# Patient Record
Sex: Female | Born: 1995 | Race: White | Hispanic: No | Smoking: Current every day smoker
Health system: Southern US, Community
[De-identification: ages and names within clinical notes are randomized; demographics above are authoritative.]

## PROBLEM LIST (undated history)

## (undated) DIAGNOSIS — G43909 Migraine, unspecified, not intractable, without status migrainosus: Secondary | ICD-10-CM

## (undated) DIAGNOSIS — J45909 Unspecified asthma, uncomplicated: Secondary | ICD-10-CM

## (undated) DIAGNOSIS — K5900 Constipation, unspecified: Secondary | ICD-10-CM

## (undated) DIAGNOSIS — T7840XA Allergy, unspecified, initial encounter: Secondary | ICD-10-CM

## (undated) DIAGNOSIS — K37 Unspecified appendicitis: Secondary | ICD-10-CM

## (undated) HISTORY — PX: ELBOW SURGERY: SHX618

## (undated) HISTORY — DX: Migraine, unspecified, not intractable, without status migrainosus: G43.909

## (undated) HISTORY — DX: Allergy, unspecified, initial encounter: T78.40XA

---
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
## 2017-05-03 ENCOUNTER — Ambulatory Visit: Payer: Medicaid Other | Admitting: Family Medicine

## 2017-05-15 NOTE — Progress Notes (Addendum)
Subjective: [name] care, allergies HPI: Amber Marsh is a 22 y.o. female presenting to clinic today for:  1. Allergies Patient reports a long-standing history of seasonal allergies.  She describes eye puffiness, tearing, sneezing.  Symptoms are well relieved by Claritin.  She needs refills on this.  2.  Exercise-induced asthma Patient reports long-standing history of asthma symptoms.  She notes that when she was initially diagnosed it was because she was having shortness of breath with track.  She notes that she rarely has to use albuterol now but occasionally will need to use it when [place].  3.  Irregular menstrual cycles/thyroid dysregulation/anemia Patient reports a long-standing history of irregular menstrual cycles.  She notes that her cycles are regulated when she was on the birth control patch.  She has been on this since age 54 with good regulation of her cycles.  She reports last menstrual cycle was in June 2018.  She is sexually active.  Denies any abnormal vaginal discharge, vaginal lesions or pelvic pain.  She has been told in the past that her thyroid is "messed up".  She is never been on any thyroid medications for this.  She also reports a past medical history of anemia.  She would like to be checked for both of these.  She is a current everyday smoker.  4.  Daily smoker Patient reports that she has been a daily smoker for the last 3 years.  She smokes up to 10 cigarettes/day per her husband's report.  Denies any coughing, hemoptysis, unplanned weight loss.  5. Migraine headache Patient reports that she has about 1 migraine headache per month.  This is relieved by Imitrex 50 mg.  She notes that she was diagnosed with migraine headaches at age 42 years but started Imitrex at age 62 years old.  Past Medical History:  Diagnosis Date  . Allergy    seasonal allergy  . Appendicitis 05/2016  . Asthma    exercise induced   Past  Surgical History:  Procedure Laterality Date  . APPENDECTOMY  06/10/2016  . KNEE SURGERY Right 2018  . LAPAROSCOPIC APPENDECTOMY N/A 06/10/2016   Procedure: APPENDECTOMY LAPAROSCOPIC OPEN;  Surgeon: Erroll Luna, MD;  Location: [facility];  Service: General;  Laterality: N/A;   Social History   Socioeconomic History  . Marital status: Significant Other    Spouse name: Not on file  . Number of children: 0  . Years of education: Not on file  . Highest education level: Not on file  Occupational History  . Occupation: Educational psychologist    Comment: [place]  Social Needs  . Financial resource strain: Not on file  . Food insecurity:    Worry: Not on file    Inability: Not on file  . Transportation needs:    Medical: Not on file    Non-medical: Not on file  Tobacco Use  . Smoking status: Current Every Day Smoker    Packs/day: 0.50    Years: 3.00    Pack years: 1.50    Types: Cigarettes  . Smokeless tobacco: Never Used  Substance and Sexual Activity  . Alcohol use: Yes    Comment: occasional  . Drug use: No  . Sexual activity: Yes    Birth control/protection: Patch  Lifestyle  . Physical activity:    Days per week: Not on file    Minutes per session: Not on file  . Stress: Not on file  Relationships  . Social  connections:    Talks on phone: Not on file    Gets together: Not on file    Attends religious service: Not on file    Active member of club or organization: Not on file    Attends meetings of clubs or organizations: Not on file    Relationship status: Not on file  . Intimate partner violence:    Fear of current or ex partner: Not on file    Emotionally abused: Not on file    Physically abused: Not on file    Forced sexual activity: Not on file  Other Topics Concern  . Not on file  Social History Narrative  . Not on file   No outpatient medications have been marked as taking for the 05/21/17 encounter (Office Visit) with Janora Norlander, DO.   Family History    Problem Relation Age of Onset  . Migraines Mother   . Hypertension Father   . Hypertension Maternal Grandmother   . Migraines Maternal Grandmother   . Diabetes Paternal Grandmother    Allergies  Allergen Reactions  . Grapeseed Extract [Nutritional Supplements]   . Latex   . Morphine And Related   . Penicillins     Childhood allergy     Health Maintenance: UTD ROS: Per HPI  Objective: Office vital signs reviewed. BP 109/71   Pulse (!) 108   Temp 98 F (36.7 C) (Oral)   Ht 5' 4" (1.626 m)   Wt 180 lb (81.6 kg)   BMI 30.90 kg/m   Physical Examination:  General: Awake, alert, well nourished, No acute distress HEENT: Normal    Neck: No masses palpated. No lymphadenopathy; no thyroid enlargement.  No palpable thyroid nodules    ears: Tympanic membranes intact, normal light reflex, no erythema, no bulging    Eyes: PERRLA, extraocular movement in tact, sclera white    Nose: nasal turbinates moist, clear nasal discharge    Throat: moist mucus membranes, no erythema, no tonsillar exudate.  Airway is patent Cardio: regular rate and rhythm, S1S2 heard, no murmurs appreciated Pulm: clear to auscultation bilaterally, no wheezes, rhonchi or rales; normal work of breathing on room air Extremities: warm, well perfused, No edema, cyanosis or clubbing; +2 pulses bilaterally MSK: Normal gait and normal station Skin: dry; intact; no rashes or lesions Neuro: No focal neurologic deficits. Psych: Mood stable, affect flat, eye contact fair, does not appear to be responding to internal stimuli Depression screen PHQ 2/9 06/21/2016  Decreased Interest 0  Down, Depressed, Hopeless 0  PHQ - 2 Score 0     Assessment/ Plan: 22 y.o. female   1. Encounter for initial prescription of transdermal patch hormonal contraceptive device Upreg negative. She has been using for contraception and dysfunctional uterine bleeding.  Will check labs as above.  If she has not had a pelvic ultrasound, she may  benefit from this.  We will continue to follow. - Pregnancy, urine  2. Establishing care with new doctor, encounter for   3. Exercise induced bronchospasm Albuterol inhaler refilled.  If she is requiring this more than twice per week, she will follow-up to be placed on a controller medication.  4. Seasonal allergies Claritin tablets refilled.  Follow-up as needed.  5. Obesity (BMI 30-39.9) Check TSH CMP.  Handout given for lifestyle modification and preventative care. - CMP14+EGFR - TSH  6. History of anemia Check CBC. - CBC with Differential  7. Migraine without aura and without status migrainosus, not intractable Imitrex 50 mg refilled.  Follow-up as needed.  8. Tobacco use Counseling performed.  Patient is contemplative.    Janora Norlander, DO [place] [phone]

## 2017-05-21 ENCOUNTER — Ambulatory Visit (INDEPENDENT_AMBULATORY_CARE_PROVIDER_SITE_OTHER): Payer: Medicaid Other | Admitting: Family Medicine

## 2017-05-21 ENCOUNTER — Encounter: Payer: Self-pay | Admitting: Family Medicine

## 2017-05-21 VITALS — BP 109/71 | HR 108 | Temp 98.0°F | Ht 64.0 in | Wt 180.0 lb

## 2017-05-21 DIAGNOSIS — Z30016 Encounter for initial prescription of transdermal patch hormonal contraceptive device: Secondary | ICD-10-CM | POA: Diagnosis not present

## 2017-05-21 DIAGNOSIS — Z7689 Persons encountering health services in other specified circumstances: Secondary | ICD-10-CM

## 2017-05-21 DIAGNOSIS — G43009 Migraine without aura, not intractable, without status migrainosus: Secondary | ICD-10-CM | POA: Diagnosis not present

## 2017-05-21 DIAGNOSIS — E669 Obesity, unspecified: Secondary | ICD-10-CM | POA: Insufficient documentation

## 2017-05-21 DIAGNOSIS — J45990 Exercise induced bronchospasm: Secondary | ICD-10-CM | POA: Diagnosis not present

## 2017-05-21 DIAGNOSIS — Z862 Personal history of diseases of the blood and blood-forming organs and certain disorders involving the immune mechanism: Secondary | ICD-10-CM

## 2017-05-21 DIAGNOSIS — J302 Other seasonal allergic rhinitis: Secondary | ICD-10-CM | POA: Insufficient documentation

## 2017-05-21 DIAGNOSIS — Z720 Tobacco use: Secondary | ICD-10-CM | POA: Insufficient documentation

## 2017-05-21 LAB — PREGNANCY, URINE: Preg Test, Ur: NEGATIVE

## 2017-05-21 MED ORDER — NORELGESTROMIN-ETH ESTRADIOL 150-35 MCG/24HR TD PTWK
1.0000 | MEDICATED_PATCH | TRANSDERMAL | 4 refills | Status: DC
Start: 2017-05-21 — End: 2017-11-27

## 2017-05-21 MED ORDER — SUMATRIPTAN SUCCINATE 50 MG PO TABS: 50.0000 mg | ORAL_TABLET | ORAL | 1 refills | Status: DC | PRN

## 2017-05-21 MED ORDER — ALBUTEROL SULFATE HFA 108 (90 BASE) MCG/ACT IN AERS: 1.0000 | INHALATION_SPRAY | RESPIRATORY_TRACT | 1 refills | Status: DC | PRN

## 2017-05-21 MED ORDER — LORATADINE 10 MG PO TABS: 10.0000 mg | ORAL_TABLET | Freq: Every day | ORAL | 4 refills | Status: DC

## 2017-05-21 NOTE — Patient Instructions (Signed)
You had labs performed today.  You will be contacted with the results of the labs once they are available, usually in the next 3 days for routine lab work.   Preventive Care 18-39 Years, Female Preventive care refers to lifestyle choices and visits with your health care provider that can promote health and wellness. What does preventive care include?  A yearly physical exam. This is also called an annual well check.  Dental exams once or twice a year.  Routine eye exams. Ask your health care provider how often you should have your eyes checked.  Personal lifestyle choices, including: ? Daily care of your teeth and gums. ? Regular physical activity. ? Eating a healthy diet. ? Avoiding tobacco and drug use. ? Limiting alcohol use. ? Practicing safe sex. ? Taking vitamin and mineral supplements as recommended by your health care provider. What happens during an annual well check? The services and screenings done by your health care provider during your annual well check will depend on your age, overall health, lifestyle risk factors, and family history of disease. Counseling Your health care provider may ask you questions about your:  Alcohol use.  Tobacco use.  Drug use.  Emotional well-being.  Home and relationship well-being.  Sexual activity.  Eating habits.  Work and work Statistician.  Method of birth control.  Menstrual cycle.  Pregnancy history.  Screening You may have the following tests or measurements:  Height, weight, and BMI.  Diabetes screening. This is done by checking your blood sugar (glucose) after you have not eaten for a while (fasting).  Blood pressure.  Lipid and cholesterol levels. These may be checked every 5 years starting at age 64.  Skin check.  Hepatitis C blood test.  Hepatitis B blood test.  Sexually transmitted disease (STD) testing.  BRCA-related cancer screening. This may be done if you have a family history of breast,  ovarian, tubal, or peritoneal cancers.  Pelvic exam and Pap test. This may be done every 3 years starting at age 59. Starting at age 38, this may be done every 5 years if you have a Pap test in combination with an HPV test.  Discuss your test results, treatment options, and if necessary, the need for more tests with your health care provider. Vaccines Your health care provider may recommend certain vaccines, such as:  Influenza vaccine. This is recommended every year.  Tetanus, diphtheria, and acellular pertussis (Tdap, Td) vaccine. You may need a Td booster every 10 years.  Varicella vaccine. You may need this if you have not been vaccinated.  HPV vaccine. If you are 82 or younger, you may need three doses over 6 months.  Measles, mumps, and rubella (MMR) vaccine. You may need at least one dose of MMR. You may also need a second dose.  Pneumococcal 13-valent conjugate (PCV13) vaccine. You may need this if you have certain conditions and were not previously vaccinated.  Pneumococcal polysaccharide (PPSV23) vaccine. You may need one or two doses if you smoke cigarettes or if you have certain conditions.  Meningococcal vaccine. One dose is recommended if you are age 41-21 years and a first-year college student living in a residence hall, or if you have one of several medical conditions. You may also need additional booster doses.  Hepatitis A vaccine. You may need this if you have certain conditions or if you travel or work in places where you may be exposed to hepatitis A.  Hepatitis B vaccine. You may need this if  you have certain conditions or if you travel or work in places where you may be exposed to hepatitis B.  Haemophilus influenzae type b (Hib) vaccine. You may need this if you have certain risk factors.  Talk to your health care provider about which screenings and vaccines you need and how often you need them. This information is not intended to replace advice given to you by  your health care provider. Make sure you discuss any questions you have with your health care provider. Document Released: 02/27/2001 Document Revised: 09/21/2015 Document Reviewed: 11/02/2014 Elsevier Interactive Patient Education  [employer].

## 2017-05-22 LAB — CBC WITH DIFFERENTIAL/PLATELET
BASOS: 0 %
Basophils Absolute: 0 10*3/uL (ref 0.0–0.2)
EOS (ABSOLUTE): 0.1 10*3/uL (ref 0.0–0.4)
Eos: 1 %
HEMATOCRIT: 40.2 % (ref 34.0–46.6)
HEMOGLOBIN: 13.7 g/dL (ref 11.1–15.9)
Immature Grans (Abs): 0 10*3/uL (ref 0.0–0.1)
Immature Granulocytes: 0 %
LYMPHS: 35 %
Lymphocytes Absolute: 3.1 10*3/uL (ref 0.7–3.1)
MCH: 27.9 pg (ref 26.6–33.0)
MCHC: 34.1 g/dL (ref 31.5–35.7)
MCV: 82 fL (ref 79–97)
Monocytes Absolute: 0.7 10*3/uL (ref 0.1–0.9)
Monocytes: 8 %
NEUTROS PCT: 56 %
Neutrophils Absolute: 4.8 10*3/uL (ref 1.4–7.0)
Platelets: 276 10*3/uL (ref 150–379)
RBC: 4.91 x10E6/uL (ref 3.77–5.28)
RDW: 14.6 % (ref 12.3–15.4)
WBC: 8.8 10*3/uL (ref 3.4–10.8)

## 2017-05-22 LAB — CMP14+EGFR
ALK PHOS: 112 IU/L (ref 39–117)
ALT: 10 IU/L (ref 0–32)
AST: 14 IU/L (ref 0–40)
Albumin/Globulin Ratio: 1.2 (ref 1.2–2.2)
Albumin: 3.8 g/dL (ref 3.5–5.5)
BUN/Creatinine Ratio: 18 (ref 9–23)
BUN: 11 mg/dL (ref 6–20)
Bilirubin Total: 0.4 mg/dL (ref 0.0–1.2)
CO2: 20 mmol/L (ref 20–29)
CREATININE: 0.62 mg/dL (ref 0.57–1.00)
Calcium: 9.3 mg/dL (ref 8.7–10.2)
Chloride: 106 mmol/L (ref 96–106)
GFR calc Af Amer: 151 mL/min/{1.73_m2} (ref >59)
GFR, EST NON AFRICAN AMERICAN: 131 mL/min/{1.73_m2} (ref >59)
Globulin, Total: 3.1 g/dL (ref 1.5–4.5)
Glucose: 81 mg/dL (ref 65–99)
POTASSIUM: 4.3 mmol/L (ref 3.5–5.2)
SODIUM: 140 mmol/L (ref 134–144)
Total Protein: 6.9 g/dL (ref 6.0–8.5)

## 2017-05-22 LAB — TSH: TSH: 1.07 u[IU]/mL (ref 0.450–4.500)

## 2017-08-02 DIAGNOSIS — M25561 Pain in right knee: Secondary | ICD-10-CM | POA: Diagnosis not present

## 2017-09-08 ENCOUNTER — Other Ambulatory Visit: Payer: Self-pay

## 2017-09-08 ENCOUNTER — Emergency Department (HOSPITAL_COMMUNITY)
Admission: EM | Admit: 2017-09-08 | Discharge: 2017-09-08 | Disposition: A | Discharge status: Home / Self Care | Payer: Medicaid Other | Attending: Emergency Medicine | Admitting: Emergency Medicine

## 2017-09-08 ENCOUNTER — Emergency Department (HOSPITAL_COMMUNITY): Payer: Medicaid Other

## 2017-09-08 ENCOUNTER — Encounter (HOSPITAL_COMMUNITY): Payer: Self-pay | Admitting: Emergency Medicine

## 2017-09-08 DIAGNOSIS — F17210 Nicotine dependence, cigarettes, uncomplicated: Secondary | ICD-10-CM | POA: Diagnosis not present

## 2017-09-08 DIAGNOSIS — Z91040 Latex allergy status: Secondary | ICD-10-CM | POA: Insufficient documentation

## 2017-09-08 DIAGNOSIS — Z79899 Other long term (current) drug therapy: Secondary | ICD-10-CM | POA: Diagnosis not present

## 2017-09-08 DIAGNOSIS — R079 Chest pain, unspecified: Secondary | ICD-10-CM | POA: Diagnosis not present

## 2017-09-08 DIAGNOSIS — R0602 Shortness of breath: Secondary | ICD-10-CM | POA: Diagnosis not present

## 2017-09-08 DIAGNOSIS — R0789 Other chest pain: Secondary | ICD-10-CM | POA: Diagnosis not present

## 2017-09-08 DIAGNOSIS — J45909 Unspecified asthma, uncomplicated: Secondary | ICD-10-CM | POA: Insufficient documentation

## 2017-09-08 DIAGNOSIS — R071 Chest pain on breathing: Secondary | ICD-10-CM | POA: Diagnosis not present

## 2017-09-08 LAB — BASIC METABOLIC PANEL
ANION GAP: 8 (ref 5–15)
BUN: 9 mg/dL (ref 6–20)
CO2: 23 mmol/L (ref 22–32)
Calcium: 8.9 mg/dL (ref 8.9–10.3)
Chloride: 111 mmol/L (ref 98–111)
Creatinine, Ser: 0.55 mg/dL (ref 0.44–1.00)
GFR calc non Af Amer: >60 mL/min (ref >60)
GLUCOSE: 98 mg/dL (ref 70–99)
POTASSIUM: 3.9 mmol/L (ref 3.5–5.1)
Sodium: 142 mmol/L (ref 135–145)

## 2017-09-08 LAB — TROPONIN I

## 2017-09-08 LAB — CBC
HEMATOCRIT: 42.6 % (ref 36.0–46.0)
HEMOGLOBIN: 13.7 g/dL (ref 12.0–15.0)
MCH: 28.7 pg (ref 26.0–34.0)
MCHC: 32.2 g/dL (ref 30.0–36.0)
MCV: 89.1 fL (ref 78.0–100.0)
Platelets: 272 10*3/uL (ref 150–400)
RBC: 4.78 MIL/uL (ref 3.87–5.11)
RDW: 13.5 % (ref 11.5–15.5)
WBC: 9.8 10*3/uL (ref 4.0–10.5)

## 2017-09-08 LAB — D-DIMER, QUANTITATIVE (NOT AT ARMC): D DIMER QUANT: 0.27 ug{FEU}/mL (ref 0.00–0.50)

## 2017-09-08 LAB — HCG, QUANTITATIVE, PREGNANCY: hCG, Beta Chain, Quant, S: <1 m[IU]/mL (ref <5)

## 2017-09-08 MED ORDER — METHOCARBAMOL 500 MG PO TABS: 1000.0000 mg | ORAL_TABLET | Freq: Four times a day (QID) | ORAL | 0 refills | Status: DC | PRN

## 2017-09-08 MED ORDER — IBUPROFEN 400 MG PO TABS
400.0000 mg | ORAL_TABLET | Freq: Once | ORAL | Status: AC
  Administered 2017-09-08: 400 mg via ORAL
  Filled 2017-09-08: qty 1

## 2017-09-08 MED ORDER — ACETAMINOPHEN 325 MG PO TABS
650.0000 mg | ORAL_TABLET | Freq: Once | ORAL | Status: AC
  Administered 2017-09-08: 650 mg via ORAL
  Filled 2017-09-08: qty 2

## 2017-09-08 MED ORDER — NAPROXEN 250 MG PO TABS: 250.0000 mg | ORAL_TABLET | Freq: Two times a day (BID) | ORAL | 0 refills | Status: DC | PRN

## 2017-09-08 NOTE — ED Notes (Signed)
Pt ambulated in hall   Pulse ox between 98-100 while ambulating  [place] "not connected" with previous physician  NAD  Awaiting eval and DC

## 2017-09-08 NOTE — ED Notes (Signed)
Pt hooked up to 5 lead, changed into gown, given warm blanket

## 2017-09-08 NOTE — Discharge Instructions (Signed)
Take the prescriptions as directed.  Apply moist heat or ice to the area(s) of discomfort, for 15 minutes at a time, several times per day for the next few days.  Do not fall asleep on a heating or ice pack.  Call your regular medical doctor on Monday to schedule a follow up appointment this week.  Return to the Emergency Department immediately if worsening. ° °

## 2017-09-08 NOTE — ED Notes (Signed)
Pt request upon discharge a work excuse   States she works at [employer] "doing basically everything"  Work excuse provided per pt request

## 2017-09-08 NOTE — ED Triage Notes (Signed)
Pt c/o R sided CP for several days. Has had increased stress. States CP is worse with palpation to chest, stress, and deep breathing. No PO medications today.

## 2017-09-08 NOTE — ED Provider Notes (Signed)
[facility] EMERGENCY DEPARTMENT Provider Note   CSN: [id number] Arrival date & time: 09/08/17  1637     History   Chief Complaint Chief Complaint  Patient presents with  . Chest Pain    HPI Amber Marsh is a 22 y.o. female.  HPI  Pt was seen at 2010. Per pt, c/o gradual onset and persistence of constant right sided chest wall "pain" for the past 2 to 3 days. Pt describes the CP as "stabbing," constant, and worse with palpation of the area and deep breath. Pt states she is "under a lot of stress" before the pain began. Pt states the pain began while at work at the [employer]. 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   Past Medical History:  Diagnosis Date  . Allergy    seasonal allergy  . Appendicitis 05/2016  . Asthma    exercise induced    Patient Active Problem List   Diagnosis Date Noted  . Obesity (BMI 30-39.9) 05/21/2017  . Seasonal allergies 05/21/2017  . Encounter for initial prescription of transdermal patch hormonal contraceptive device 05/21/2017  . Migraine without aura and without status migrainosus, not intractable 05/21/2017  . Tobacco use 05/21/2017  . Migraines 06/24/2016  . Bronchospasm 06/24/2016    Past Surgical History:  Procedure Laterality Date  . APPENDECTOMY  06/10/2016  . KNEE SURGERY Right 2018  . LAPAROSCOPIC APPENDECTOMY N/A 06/10/2016   Procedure: APPENDECTOMY LAPAROSCOPIC OPEN;  Surgeon: Harriette Bouillonornett, Thomas, MD;  Location: [facility] OR;  Service: General;  Laterality: N/A;     OB History   None      Home Medications    Prior to Admission medications   Medication Sig Start Date End Date Taking? Authorizing Provider  albuterol (PROVENTIL HFA;VENTOLIN HFA) 108 (90 Base) MCG/ACT inhaler Inhale 1-2 puffs into the lungs every 4 (four) hours as needed for wheezing or shortness of breath.  05/21/17  Yes Gottschalk, Kathie RhodesAshly M, DO  ibuprofen (ADVIL,MOTRIN) 200 MG tablet Take 200 mg by mouth every 6 (six) hours as needed.   Yes [provider]  loratadine (CLARITIN) 10 MG tablet Take 1 tablet (10 mg total) by mouth daily. 05/21/17  Yes Gottschalk, Kathie RhodesAshly M, DO  norelgestromin-ethinyl estradiol Burr Medico(XULANE) 150-35 MCG/24HR transdermal patch Place 1 patch onto the skin once a week. 05/21/17  Yes Gottschalk, Kathie RhodesAshly M, DO  SUMAtriptan (IMITREX) 50 MG tablet Take 1 tablet (50 mg total) by mouth every 2 (two) hours as needed for migraine. May repeat 1x in 2 hours if headache persists or recurs. 05/21/17  Yes Raliegh IpGottschalk, Ashly M, DO    Family History Family History  Problem Relation Age of Onset  . Migraines Mother   . Hypertension Father   . Hypertension Maternal Grandmother   . Migraines Maternal Grandmother   . Diabetes Paternal Grandmother     Social History Social History   Tobacco Use  . Smoking status: Current Every Day Smoker    Packs/day: 0.50    Years: 3.00    Pack years: 1.50    Types: Cigarettes  . Smokeless tobacco: Never Used  Substance Use Topics  . Alcohol use: Yes    Comment: occasional  . Drug use: No     Allergies   Grapeseed extract [nutritional supplements]; Latex; Morphine and related; and Penicillins   Review of Systems Review of Systems  ROS: Statement: All systems negative except as marked or noted in the HPI; Constitutional: Negative for fever and chills. ; ; Eyes: Negative for eye pain, redness and discharge. ; ; ENMT: Negative for ear pain, hoarseness, nasal congestion, sinus pressure and sore throat. ; ; Cardiovascular: Negative for palpitations, diaphoresis, dyspnea and peripheral edema. ; ; Respiratory: Negative for cough, wheezing and stridor. ; ; Gastrointestinal: Negative for nausea, vomiting, diarrhea, abdominal pain, blood in stool, hematemesis, jaundice and rectal bleeding. . ; ; Genitourinary: Negative for dysuria, flank pain and hematuria. ;  ; Musculoskeletal: +chest wall pain. Negative for back pain and neck pain. Negative for swelling and trauma.; ; Skin: Negative for pruritus, rash, abrasions, blisters, bruising and skin lesion.; ; Neuro: Negative for headache, lightheadedness and neck stiffness. Negative for weakness, altered level of consciousness, altered mental status, extremity weakness, paresthesias, involuntary movement, seizure and syncope.       Physical Exam Updated Vital Signs BP 106/70   Pulse 79   Temp 98.2 F (36.8 C) (Oral)   Resp 16   Ht 5\' 8"  (1.727 m)   Wt 81.6 kg   LMP 08/18/2017   SpO2 100%   BMI 27.35 kg/m   Physical Exam 2015: Physical examination:  Nursing notes reviewed; Vital signs and O2 SAT reviewed;  Constitutional: Well developed, Well nourished, Well hydrated, In no acute distress; Head:  Normocephalic, atraumatic; Eyes: EOMI, PERRL, No scleral icterus; ENMT: Mouth and pharynx normal, Mucous membranes moist; Neck: Supple, Full range of motion, No lymphadenopathy; Cardiovascular: Regular rate and rhythm, No gallop; Respiratory: Breath sounds clear & equal bilaterally, No wheezes.  Speaking full sentences with ease, Normal respiratory effort/excursion; Chest: +right anterior chest wall and parasternal areas tender to palp. No deformity, no soft tissue crepitus. Movement normal; Abdomen: Soft, Nontender, Nondistended, Normal bowel sounds; Genitourinary: No CVA tenderness; Extremities: Peripheral pulses normal, No tenderness, No edema, No calf edema or asymmetry.; Neuro: AA&Ox3, Major CN grossly intact.  Speech clear. No gross focal motor or sensory deficits in extremities.; Skin: Color normal, Warm, Dry.   ED Treatments / Results  Labs (all labs ordered are listed, but only abnormal results are displayed)   EKG EKG Interpretation  Date/Time:  Sunday September 08 2017 16:38:29 EDT Ventricular Rate:  103 PR Interval:  130 QRS Duration: 78 QT Interval:  346 QTC Calculation: 453 R  Axis:   31 Text Interpretation:  Sinus tachycardia Nonspecific T wave abnormality Baseline wander No old tracing to compare Confirmed by Samuel Jester [phone]) on 09/08/2017 8:11:17 PM   Radiology   Procedures Procedures (including critical care time)  Medications Ordered in ED Medications  acetaminophen (TYLENOL) tablet 650 mg (650 mg Oral Given 09/08/17 2028)  ibuprofen (ADVIL,MOTRIN) tablet 400 mg (400 mg Oral Given 09/08/17 2027)     Initial Impression / Assessment and Plan / ED Course  I have reviewed the triage vital signs and the nursing notes.  Pertinent labs & imaging results that were available during my care of the patient were reviewed by me and considered in my medical decision making (see chart for details).  MDM Reviewed: previous chart, nursing note and vitals Reviewed previous: labs Interpretation: labs, ECG and x-ray   Results for orders placed or performed during the hospital encounter of 09/08/17  Basic metabolic panel  Result Value Ref Range   Sodium 142 135 - 145 mmol/L   Potassium 3.9 3.5 - 5.1 mmol/L   Chloride 111 98 - 111 mmol/L   CO2 23 22 - 32  mmol/L   Glucose, Bld 98 70 - 99 mg/dL   BUN 9 6 - 20 mg/dL   Creatinine, Ser 8.11 0.44 - 1.00 mg/dL   Calcium 8.9 8.9 - 91.4 mg/dL   GFR calc non Af Amer >60 >60 mL/min   GFR calc Af Amer >60 >60 mL/min   Anion gap 8 5 - 15  CBC  Result Value Ref Range   WBC 9.8 4.0 - 10.5 K/uL   RBC 4.78 3.87 - 5.11 MIL/uL   Hemoglobin 13.7 12.0 - 15.0 g/dL   HCT 78.2 95.6 - 21.3 %   MCV 89.1 78.0 - 100.0 fL   MCH 28.7 26.0 - 34.0 pg   MCHC 32.2 30.0 - 36.0 g/dL   RDW 08.6 57.8 - 46.9 %   Platelets 272 150 - 400 K/uL  Troponin I  Result Value Ref Range   Troponin I <0.03 <0.03 ng/mL  hCG, quantitative, pregnancy  Result Value Ref Range   hCG, Beta Chain, Quant, S <1 <5 mIU/mL  D-dimer, quantitative  Result Value Ref Range   D-Dimer, Quant 0.27 0.00 - 0.50 ug/mL-FEU   Dg Chest 2 View Result Date:  09/08/2017 CLINICAL DATA:  RIGHT-sided central chest pain for a few days with shortness of breath, worsened chest pain with stress, palpation and deep breathing, history asthma, smoking EXAM: CHEST - 2 VIEW COMPARISON:  None FINDINGS: Normal heart size, mediastinal contours, and pulmonary vascularity. Lungs clear. No pleural effusion or pneumothorax. Bones unremarkable. IMPRESSION: Normal exam. Electronically Signed   By: Ulyses Southward M.D.   On: 09/08/2017 17:11    2035:  Doubt PE as cause for symptoms with normal d-dimer and low risk [place].  Doubt ACS as cause for symptoms with normal troponin and unchanged EKG from previous after 2-3 days of constant atypical symptoms. Pt ambulated with Sats remaining 98-100% R/A.  Tx symptomatically at this time. Dx and testing d/w pt.  Questions answered.  Verb understanding, agreeable to d/c home with outpt f/u.    Final Clinical Impressions(s) / ED Diagnoses   Final diagnoses:  None    ED Discharge Orders    None       Samuel Jester, DO 09/12/17 0003

## 2017-10-15 DIAGNOSIS — K029 Dental caries, unspecified: Secondary | ICD-10-CM | POA: Diagnosis not present

## 2017-11-27 ENCOUNTER — Other Ambulatory Visit: Payer: Self-pay | Admitting: Family Medicine

## 2018-01-24 ENCOUNTER — Other Ambulatory Visit: Payer: Self-pay | Admitting: Family Medicine

## 2018-01-24 ENCOUNTER — Encounter: Payer: Self-pay | Admitting: Family Medicine

## 2018-01-24 ENCOUNTER — Ambulatory Visit (INDEPENDENT_AMBULATORY_CARE_PROVIDER_SITE_OTHER): Payer: Medicaid Other | Admitting: Family Medicine

## 2018-01-24 VITALS — BP 108/74 | HR 96 | Temp 96.8°F | Ht 68.0 in | Wt 182.0 lb

## 2018-01-24 DIAGNOSIS — N926 Irregular menstruation, unspecified: Secondary | ICD-10-CM

## 2018-01-24 DIAGNOSIS — Z3045 Encounter for surveillance of transdermal patch hormonal contraceptive device: Secondary | ICD-10-CM

## 2018-01-24 DIAGNOSIS — N912 Amenorrhea, unspecified: Secondary | ICD-10-CM

## 2018-01-24 DIAGNOSIS — M25561 Pain in right knee: Secondary | ICD-10-CM

## 2018-01-24 DIAGNOSIS — J069 Acute upper respiratory infection, unspecified: Secondary | ICD-10-CM | POA: Diagnosis not present

## 2018-01-24 DIAGNOSIS — N970 Female infertility associated with anovulation: Secondary | ICD-10-CM

## 2018-01-24 DIAGNOSIS — G8929 Other chronic pain: Secondary | ICD-10-CM

## 2018-01-24 LAB — PREGNANCY, URINE: Preg Test, Ur: NEGATIVE

## 2018-01-24 MED ORDER — LORATADINE 10 MG PO TABS: 10.0000 mg | ORAL_TABLET | Freq: Every day | ORAL | 4 refills | Status: DC

## 2018-01-24 MED ORDER — SUMATRIPTAN SUCCINATE 50 MG PO TABS: 50.0000 mg | ORAL_TABLET | ORAL | 1 refills | Status: DC | PRN

## 2018-01-24 MED ORDER — ALBUTEROL SULFATE HFA 108 (90 BASE) MCG/ACT IN AERS: 1.0000 | INHALATION_SPRAY | RESPIRATORY_TRACT | 1 refills | Status: DC | PRN

## 2018-01-24 MED ORDER — NORELGESTROMIN-ETH ESTRADIOL 150-35 MCG/24HR TD PTWK: MEDICATED_PATCH | TRANSDERMAL | 12 refills | Status: DC

## 2018-01-24 MED ORDER — METHOCARBAMOL 500 MG PO TABS: 1000.0000 mg | ORAL_TABLET | Freq: Four times a day (QID) | ORAL | 0 refills | Status: DC | PRN

## 2018-01-24 NOTE — Patient Instructions (Signed)

## 2018-01-24 NOTE — Progress Notes (Signed)
Subjective: CC: refill BC patch PCP: Amber Marsh, Amber Cleverly M, DO ZOX:WRUEAVWUHPI:Amber Marsh is a 23 y.o. female presenting to clinic today for:  1. Abnormal menses Patient is coming to the office by her father today who notes that she had abnormal menstrual cycles since age 23.  She had not gotten her menstrual cycle in her late teens and therefore was [facility].  He notes that multiple studies were done but no one was able to figure out why she had problems with menses.  She has since been on a birth control patch to regulate menses.  From his report she was supposed to follow-up with someone to continue monitoring her hormone levels and he is wondering if this is something that we can get done for her.  She removed her patch a few days ago and is expecting her period any day now.  2.  URI Patient reports a 4-day history of cough and cold symptoms.  She has been having nasal congestion, sinus fullness and rhinorrhea.  She has had subjective fevers, decreased appetite and some nausea with eating.  She has been using over-the-counter cough and cold remedies with little improvement in symptoms.  3.  Right-sided knee pain Patient reports a longstanding history of right-sided knee pain.  She has had surgery on the right knee previously.  She notes that symptoms are relieved by home NSAID and Robaxin.  She needs a refill on the Robaxin.      ROS: Per HPI  Allergies  Allergen Reactions  . Grapeseed Extract [Nutritional Supplements]   . Latex   . Morphine And Related   . Penicillins     Childhood allergy   Past Medical History:  Diagnosis Date  . Allergy    seasonal allergy  . Appendicitis 05/2016  . Asthma    exercise induced    Current Outpatient Medications:  .  albuterol (PROVENTIL HFA;VENTOLIN HFA) 108 (90 Base) MCG/ACT inhaler, Inhale 1-2 puffs into the lungs every 4 (four) hours as needed for wheezing or shortness of breath. (Patient not taking: Reported on 01/24/2018),  Disp: 1 Inhaler, Rfl: 1 .  ibuprofen (ADVIL,MOTRIN) 200 MG tablet, Take 200 mg by mouth every 6 (six) hours as needed., Disp: , Rfl:  .  loratadine (CLARITIN) 10 MG tablet, Take 1 tablet (10 mg total) by mouth daily. (Patient not taking: Reported on 01/24/2018), Disp: 90 tablet, Rfl: 4 .  methocarbamol (ROBAXIN) 500 MG tablet, Take 2 tablets (1,000 mg total) by mouth 4 (four) times daily as needed for muscle spasms (muscle spasm/pain). (Patient not taking: Reported on 01/24/2018), Disp: 25 tablet, Rfl: 0 .  naproxen (NAPROSYN) 250 MG tablet, Take 1 tablet (250 mg total) by mouth 2 (two) times daily as needed for mild pain or moderate pain (take with food). (Patient not taking: Reported on 01/24/2018), Disp: 14 tablet, Rfl: 0 .  SUMAtriptan (IMITREX) 50 MG tablet, Take 1 tablet (50 mg total) by mouth every 2 (two) hours as needed for migraine. May repeat 1x in 2 hours if headache persists or recurs. (Patient not taking: Reported on 01/24/2018), Disp: 10 tablet, Rfl: 1 .  XULANE 150-35 MCG/24HR transdermal patch, APPLY 1 PATCH ONCE A WEEK, Disp: 3 patch, Rfl: 6 Social History   Socioeconomic History  . Marital status: Significant Other    Spouse name: Not on file  . Number of children: 0  . Years of education: Not on file  . Highest education level: Not on file  Occupational History  .  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 Financial resource strain: Not on file  . Food insecurity:    Worry: Not on file    Inability: Not on file  . Transportation needs:    Medical: Not on file    Non-medical: Not on file  Tobacco Use  . Smoking status: Current Every Day Smoker    Packs/day: 0.50    Years: 3.00    Pack years: 1.50    Types: Cigarettes  . Smokeless tobacco: Never Used  Substance and Sexual Activity  . Alcohol use: Yes    Comment: occasional  . Drug use: No  . Sexual activity: Yes    Birth control/protection: Patch  Lifestyle  . Physical activity:    Days per  week: Not on file    Minutes per session: Not on file  . Stress: Not on file  Relationships  . Social connections:    Talks on phone: Not on file    Gets together: Not on file    Attends religious service: Not on file    Active member of club or organization: Not on file    Attends meetings of clubs or organizations: Not on file    Relationship status: Not on file  . Intimate partner violence:    Fear of current or ex partner: Not on file    Emotionally abused: Not on file    Physically abused: Not on file    Forced sexual activity: Not on file  Other Topics Concern  . Not on file  Social History Narrative  . Not on file   Family History  Problem Relation Age of Onset  . Migraines Mother   . Hypertension Father   . Hypertension Maternal Grandmother   . Migraines Maternal Grandmother   . Diabetes Paternal Grandmother     Objective: Office vital signs reviewed. BP 108/74   Pulse 96   Temp (!) 96.8 F (36 C) (Oral)   Ht 5\' 8"  (1.727 Marsh)   Wt 182 lb (82.6 kg)   BMI 27.67 kg/Marsh   Physical Examination:  General: Awake, alert, ill appearing, No acute distress HEENT: Normal    Neck: No masses palpated. No lymphadenopathy    Ears: Tympanic membranes intact, normal light reflex, no erythema, no bulging    Eyes: PERRLA, extraocular membranes intact, sclera white    Nose: nasal turbinates moist, clear nasal discharge    Throat: moist mucus membranes, no erythema, no tonsillar exudate.  Airway is patent Cardio: regular rate and rhythm, S1S2 heard, no murmurs appreciated Pulm: clear to auscultation bilaterally, no wheezes, rhonchi or rales; normal work of breathing on room air  Assessment/ Plan: 23 y.o. female   1. Abnormal menses Of unknown etiology.  I am working on getting [facility].  In the interim, I have placed a referral to endocrinology to see if they might be able to assist with this.  I have refilled her birth control patch.  Urine pregnancy was  negative today. - Ambulatory referral to Endocrinology  2. Encounter for surveillance of transdermal patch hormonal contraceptive device As above - Pregnancy, urine  3. Anovulation As above - Ambulatory referral to Endocrinology  4. URI with cough and congestion Appears to be viral.  No evidence of bacterial infection on exam.  Patient is nontoxic-appearing with normal vital signs.  I have recommended ongoing supportive care measures.  Work note provided.  Follow-up PRN.  5. Chronic pain of  right knee Refills provided.   Orders Placed This Encounter  Procedures  . Pregnancy, urine  . Ambulatory referral to Endocrinology    Referral Priority:   Routine    Referral Type:   Consultation    Referral Reason:   Specialty Services Required    Number of Visits Requested:   1   Meds ordered this encounter  Medications  . norelgestromin-ethinyl estradiol [facility]) 150-35 MCG/24HR transdermal patch    Sig: APPLY 1 PATCH ONCE A WEEK    Dispense:  3 patch    Refill:  12  . SUMAtriptan (IMITREX) 50 MG tablet    Sig: Take 1 tablet (50 mg total) by mouth every 2 (two) hours as needed for migraine. May repeat 1x in 2 hours if headache persists or recurs.    Dispense:  10 tablet    Refill:  1  . DISCONTD: methocarbamol (ROBAXIN) 500 MG tablet    Sig: Take 2 tablets (1,000 mg total) by mouth 4 (four) times daily as needed for muscle spasms (muscle spasm/pain).    Dispense:  25 tablet    Refill:  0  . albuterol (PROVENTIL HFA;VENTOLIN HFA) 108 (90 Base) MCG/ACT inhaler    Sig: Inhale 1-2 puffs into the lungs every 4 (four) hours as needed for wheezing or shortness of breath.    Dispense:  1 Inhaler    Refill:  1  . loratadine (CLARITIN) 10 MG tablet    Sig: Take 1 tablet (10 mg total) by mouth daily.    Dispense:  90 tablet    Refill:  4  . methocarbamol (ROBAXIN) 500 MG tablet    Sig: Take 2 tablets (1,000 mg total) by mouth 4 (four) times daily as needed for muscle spasms (muscle  spasm/pain).    Dispense:  25 tablet    Refill:  0     Jakeel Starliper Hulen Skains, DO [facility] [phone]

## 2018-02-05 ENCOUNTER — Ambulatory Visit (INDEPENDENT_AMBULATORY_CARE_PROVIDER_SITE_OTHER): Payer: Medicaid Other | Admitting: Women's Health

## 2018-02-05 ENCOUNTER — Encounter: Payer: Self-pay | Admitting: Women's Health

## 2018-02-05 VITALS — BP 124/81 | HR 100 | Ht 67.0 in | Wt 183.0 lb

## 2018-02-05 DIAGNOSIS — R6882 Decreased libido: Secondary | ICD-10-CM

## 2018-02-05 DIAGNOSIS — R309 Painful micturition, unspecified: Secondary | ICD-10-CM

## 2018-02-05 DIAGNOSIS — E300 Delayed puberty: Secondary | ICD-10-CM

## 2018-02-05 DIAGNOSIS — Z3202 Encounter for pregnancy test, result negative: Secondary | ICD-10-CM

## 2018-02-05 DIAGNOSIS — R319 Hematuria, unspecified: Secondary | ICD-10-CM | POA: Diagnosis not present

## 2018-02-05 DIAGNOSIS — N3001 Acute cystitis with hematuria: Secondary | ICD-10-CM

## 2018-02-05 LAB — POCT URINALYSIS DIPSTICK OB
Glucose, UA: NEGATIVE
Ketones, UA: NEGATIVE
LEUKOCYTES UA: NEGATIVE
NITRITE UA: NEGATIVE
PROTEIN: NEGATIVE

## 2018-02-05 MED ORDER — SULFAMETHOXAZOLE-TRIMETHOPRIM 800-160 MG PO TABS: 1.0000 | ORAL_TABLET | Freq: Two times a day (BID) | ORAL | 0 refills | Status: DC

## 2018-02-05 NOTE — Progress Notes (Signed)
GYN VISIT Patient name: Amber Marsh MRN [id number]  Date of birth: [birth year] Chief Complaint:   Dysuria (back pain x few days/ when wipe has little  pink on paper)  History of Present Illness:   Amber Marsh is a 23 y.o. G0 Caucasian female being seen today as a new pt w/ her father for report of urinary frequency, dribbling, dysuria, hematuria, and bilateral low back pain x few days. Has not had UTI before.  Menarche at 23yo-only after being put on [facility] patch. Father states she had workup at [facility] when she was 23yo, was told her bone structure was 2 years behind, hormones were low, everything else was normal. She was started on COC at 23yo which did not initiate a period. Periods have been regular since starting patch at 23yo. Did have appendectomy in 2018 and stopped changing patch for a little while and did not have a period during this time. Dad asking about having hormones rechecked.  Pt reports low sex drive, is engaged, but doesn't have desire to have sex. Not on any antidepressants, no h/o sexual abuse.   Patient's last menstrual period was 01/28/2018. The current method of family planning is Ortho-Evra patches weekly. Last pap <23yo. Results were:  n/a Review of Systems:   Pertinent items are noted in HPI Denies fever/chills, dizziness, headaches, visual disturbances, fatigue, shortness of breath, chest pain, abdominal pain, vomiting, abnormal vaginal discharge/itching/odor/irritation, problems with periods, bowel movements, urination, or intercourse unless otherwise stated above.  Pertinent History Reviewed:  Reviewed past medical,surgical, social, obstetrical and family history.  Reviewed problem list, medications and allergies. Physical Assessment:   Vitals:   02/05/18 1333  BP: 124/81  Pulse: 100  Weight: 183 lb (83 kg)  Height: 5\' 7"  (1.702 m)  Body mass index is 28.66 kg/m.       Physical Examination:   General appearance: alert, well appearing, and in  no distress  Mental status: alert, oriented to person, place, and time  Skin: warm & dry   Cardiovascular: normal heart rate noted  Respiratory: normal respiratory effort, no distress  Abdomen: soft, non-tender   Pelvic: examination not indicated  Extremities: no edema   No CVAT  Results for orders placed or performed in visit on 02/05/18 (from the past 24 hour(s))  POC Urinalysis Dipstick OB   Collection Time: 02/05/18  1:52 PM  Result Value Ref Range   Color, UA     Clarity, UA     Glucose, UA Negative Negative   Bilirubin, UA     Ketones, UA neg    Spec Grav, UA     Blood, UA trace    pH, UA     POC,PROTEIN,UA Negative Negative, Trace, Small (1+), Moderate (2+), Large (3+), 4+   Urobilinogen, UA     Nitrite, UA neg    Leukocytes, UA Negative Negative   Appearance     Odor      Assessment & Plan:  1) Presumed UTI> rx bactrim, send urine cx  2) H/O late menarche> @ 23yo, having regular periods now w/ CHC patch, discussed can't check hormones as requested when on hormonal contraception, but her body is responding appropriately to the exogenous hormones from the patch, so hormonal testing not indicated.   3) Low sex drive> no identifiable reason, no h/o abuse, no antidepressants. Try to increase frequency in hopes of increasing desire. Declines counseling  Meds:  Meds ordered this encounter  Medications  . sulfamethoxazole-trimethoprim (BACTRIM DS,SEPTRA DS) 800-160 MG  tablet    Sig: Take 1 tablet by mouth 2 (two) times daily. X 7 days    Dispense:  14 tablet    Refill:  0    Order Specific Question:   Supervising Provider    Answer:   Duane Lope H [[id number]]    Orders Placed This Encounter  Procedures  . GC/Chlamydia Probe Amp  . Urine Culture  . POC Urinalysis Dipstick OB    Return for after 11/20 for , Pap & physical.  Cheral Marker CNM, [facility] 02/05/2018 2:36 PM

## 2018-02-05 NOTE — Addendum Note (Signed)
Addended by: Federico Flake A on: 02/05/2018 03:04 PM   Modules accepted: Orders

## 2018-02-07 LAB — URINE CULTURE

## 2018-02-11 LAB — GC/CHLAMYDIA PROBE AMP
CHLAMYDIA, DNA PROBE: NEGATIVE
NEISSERIA GONORRHOEAE BY PCR: NEGATIVE

## 2018-03-15 DIAGNOSIS — K0889 Other specified disorders of teeth and supporting structures: Secondary | ICD-10-CM | POA: Diagnosis not present

## 2018-04-23 DIAGNOSIS — W19XXXA Unspecified fall, initial encounter: Secondary | ICD-10-CM | POA: Diagnosis not present

## 2018-04-23 DIAGNOSIS — M25561 Pain in right knee: Secondary | ICD-10-CM | POA: Diagnosis not present

## 2018-04-23 DIAGNOSIS — M25521 Pain in right elbow: Secondary | ICD-10-CM | POA: Diagnosis not present

## 2018-06-27 ENCOUNTER — Other Ambulatory Visit: Payer: Self-pay

## 2018-06-27 ENCOUNTER — Ambulatory Visit (INDEPENDENT_AMBULATORY_CARE_PROVIDER_SITE_OTHER): Payer: Medicaid Other | Admitting: Family Medicine

## 2018-06-27 ENCOUNTER — Telehealth: Payer: Self-pay | Admitting: *Deleted

## 2018-06-27 DIAGNOSIS — R05 Cough: Secondary | ICD-10-CM

## 2018-06-27 DIAGNOSIS — R059 Cough, unspecified: Secondary | ICD-10-CM

## 2018-06-27 DIAGNOSIS — Z20822 Contact with and (suspected) exposure to covid-19: Secondary | ICD-10-CM

## 2018-06-27 DIAGNOSIS — Z7189 Other specified counseling: Secondary | ICD-10-CM | POA: Diagnosis not present

## 2018-06-27 DIAGNOSIS — Z20828 Contact with and (suspected) exposure to other viral communicable diseases: Secondary | ICD-10-CM

## 2018-06-27 NOTE — Patient Instructions (Addendum)
     Person Under Monitoring Name: Amber Marsh  Location: [address] [place] [address]   CORONAVIRUS DISEASE 2019 (COVID-19) Guidance for Persons Under Investigation You are being tested for the virus that causes coronavirus disease 2019 (COVID-19). Public health actions are necessary to ensure protection of your health and the health of others, and to prevent further spread of infection. COVID-19 is caused by a virus that can cause symptoms, such as fever, cough, and shortness of breath. The primary transmission from person to person is by coughing or sneezing. On February 13, 2018, the [place] announced a [employer] Emergency of International Concern and on February 14, 2018 the U.S. Department of Health and Human Services declared a public health emergency. If the virus that causesCOVID-19 spreads in the community, it could have severe public health consequences.  As a person under investigation for COVID-19, the [place] advises you to adhere to the following guidance until your test results are reported to you. If your test result is positive, you will receive additional information from your provider and your local health department at that time.   Remain at home until you are cleared by your health provider or public health authorities.   Keep a log of visitors to your home using the form provided. Any visitors to your home must be aware of your isolation status.  If you plan to move to a new address or leave the county, notify the local health department in your county.  Call a doctor or seek care if you have an urgent medical need. Before seeking medical care, call ahead and get instructions from the provider before arriving at the medical office, clinic or hospital. Notify them that you are being tested for the virus that causes COVID-19 so arrangements can be made, as necessary,  to prevent transmission to others in the healthcare setting. Next, notify the local health department in your county.  If a medical emergency arises and you need to call 911, inform the first responders that you are being tested for the virus that causes COVID-19. Next, notify the local health department in your county.  Adhere to all guidance set forth by the [place] for [facility] of patients that is based on guidance from the Center for Disease Control and Prevention with suspected or confirmed COVID-19. It is provided with this guidance for Persons Under Investigation.  Your health and the health of our community are our top priorities. Public Health officials remain available to provide assistance and counseling to you about COVID-19 and compliance with this guidance.  Provider: ____________________________________________________________ Date: ______/_____/_________  By signing below, you acknowledge that you have read and agree to comply with this Guidance for Persons Under Investigation. ______________________________________________________________ Date: ______/_____/_________  WHO DO I CALL? You can find a list of local health departments here: [email or web address] Health Department: ____________________________________________________________________ Contact Name: ________________________________________________________________________ Telephone: ___________________________________________________________________________  Marice Potter, [place], Communicable Disease Branch COVID-19 Guidance for Persons Under Investigation March 22, 2018

## 2018-06-27 NOTE — Telephone Encounter (Addendum)
Contacted pt to schedule testing; pt offered and accepted appointment at [facility] site 06/30/2018 at 0830; pt given address, location, and instructions that she and all occupants of her vehicle should wear masks; she verbalized understanding; orders placed per protocol. ----- Message from Janora Norlander, DO sent at 06/27/2018  2:49 PM EDT ----- Please assist with having this patient tested for COVID19.  Thanks so much

## 2018-06-27 NOTE — Progress Notes (Signed)
Telephone visit  Subjective: CC: cough, positive COVID19 exposure PCP: Amber Marsh, Amber Marsh is a 23 y.o. female calls for telephone consult today. Patient provides verbal consent for consult held via phone.  Location of patient: home Location of provider: [facility] Others present for call: none  1. Positive COVID19 exposure, cough She reports recent headaches and intermittent productive coughing. She reports diarrhea. No fevers, chills, nausea, vomiting. Denies hemoptysis.  She has occasional shortness of breath and wheeze. She has asthma.  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  She has known exposure to someone positive with Amber Marsh.  Her employer has directed her to be tested before she can return to work.  She is using her at home medications.   ROS: Per HPI  Allergies  Allergen Reactions  . Grapeseed Extract [Nutritional Supplements]   . Latex   . Morphine And Related   . Penicillins     Childhood allergy   Past Medical History:  Diagnosis Date  . Allergy    seasonal allergy  . Appendicitis 05/2016  . Asthma    exercise induced    Current Outpatient Medications:  .  albuterol (PROVENTIL HFA;VENTOLIN HFA) 108 (90 Base) MCG/ACT inhaler, Inhale 1-2 puffs into the lungs every 4 (four) hours as needed for wheezing or shortness of breath. (Patient not taking: Reported on 02/05/2018), Disp: 1 Inhaler, Rfl: 1 .  ibuprofen (ADVIL,MOTRIN) 200 MG tablet, Take 200 mg by mouth every 6 (six) hours as needed., Disp: , Rfl:  .  loratadine (CLARITIN) 10 MG tablet, Take 1 tablet (10 mg total) by mouth daily., Disp: 90 tablet, Rfl: 4 .  norelgestromin-ethinyl estradiol (XULANE) 150-35 MCG/24HR transdermal patch, APPLY 1 PATCH ONCE A WEEK, Disp: 3 patch, Rfl: 12 .  SUMAtriptan (IMITREX) 50 MG tablet, Take 1 tablet (50 mg total) by mouth every 2 (two) hours as needed for migraine. May repeat 1x in 2 hours if headache persists or recurs. (Patient not taking: Reported on  02/05/2018), Disp: 10 tablet, Rfl: 1  Assessment/ Plan: 23 y.o. female   1. Cough I have ordered the COVID test and informed patient that she will be contacted in the next day or so to get this scheduled.  Given first-degree contact and work with the public I think that patient should indeed be tested.  In the interim, I have advised her to proceed with isolation.  Okay to continue OTC and prescribed medications if needed for cough.  We discussed reasons for urgent evaluation.  I have given her a work note and will be happy to complete FMLA if needed.  2. Advice Given About Covid-19 Virus by Telephone  3. Exposure to Covid-19 Virus   Start time: 2:40pm End time: 2:49pm  Total time spent on patient care (including telephone call/ virtual visit): 15 minutes  [place], [place] [phone]

## 2018-06-30 ENCOUNTER — Other Ambulatory Visit: Payer: Medicaid Other

## 2018-06-30 DIAGNOSIS — Z20822 Contact with and (suspected) exposure to covid-19: Secondary | ICD-10-CM

## 2018-06-30 DIAGNOSIS — R6889 Other general symptoms and signs: Secondary | ICD-10-CM | POA: Diagnosis not present

## 2018-07-03 ENCOUNTER — Emergency Department (HOSPITAL_COMMUNITY): Payer: Medicaid Other

## 2018-07-03 ENCOUNTER — Telehealth: Payer: Self-pay | Admitting: Family Medicine

## 2018-07-03 ENCOUNTER — Other Ambulatory Visit: Payer: Self-pay

## 2018-07-03 ENCOUNTER — Emergency Department (HOSPITAL_COMMUNITY)
Admission: EM | Admit: 2018-07-03 | Discharge: 2018-07-03 | Disposition: A | Discharge status: Home / Self Care | Payer: Medicaid Other | Attending: Emergency Medicine | Admitting: Emergency Medicine

## 2018-07-03 DIAGNOSIS — J45909 Unspecified asthma, uncomplicated: Secondary | ICD-10-CM | POA: Diagnosis not present

## 2018-07-03 DIAGNOSIS — F17210 Nicotine dependence, cigarettes, uncomplicated: Secondary | ICD-10-CM | POA: Insufficient documentation

## 2018-07-03 DIAGNOSIS — Z79899 Other long term (current) drug therapy: Secondary | ICD-10-CM | POA: Insufficient documentation

## 2018-07-03 DIAGNOSIS — Z91040 Latex allergy status: Secondary | ICD-10-CM | POA: Insufficient documentation

## 2018-07-03 DIAGNOSIS — R1031 Right lower quadrant pain: Secondary | ICD-10-CM

## 2018-07-03 DIAGNOSIS — R51 Headache: Secondary | ICD-10-CM | POA: Diagnosis not present

## 2018-07-03 DIAGNOSIS — R109 Unspecified abdominal pain: Secondary | ICD-10-CM | POA: Diagnosis not present

## 2018-07-03 LAB — CBC
HCT: 45 % (ref 36.0–46.0)
Hemoglobin: 14.8 g/dL (ref 12.0–15.0)
MCH: 29 pg (ref 26.0–34.0)
MCHC: 32.9 g/dL (ref 30.0–36.0)
MCV: 88.2 fL (ref 80.0–100.0)
Platelets: 306 10*3/uL (ref 150–400)
RBC: 5.1 MIL/uL (ref 3.87–5.11)
RDW: 12.9 % (ref 11.5–15.5)
WBC: 13.4 10*3/uL — ABNORMAL HIGH (ref 4.0–10.5)
nRBC: 0 % (ref 0.0–0.2)

## 2018-07-03 LAB — URINALYSIS, ROUTINE W REFLEX MICROSCOPIC
Bilirubin Urine: NEGATIVE
Glucose, UA: NEGATIVE mg/dL
Hgb urine dipstick: NEGATIVE
Ketones, ur: NEGATIVE mg/dL
Leukocytes,Ua: NEGATIVE
Nitrite: NEGATIVE
Protein, ur: NEGATIVE mg/dL
Specific Gravity, Urine: 1.021 (ref 1.005–1.030)
pH: 5 (ref 5.0–8.0)

## 2018-07-03 LAB — COMPREHENSIVE METABOLIC PANEL
ALT: 29 U/L (ref 0–44)
AST: 29 U/L (ref 15–41)
Albumin: 3.9 g/dL (ref 3.5–5.0)
Alkaline Phosphatase: 94 U/L (ref 38–126)
Anion gap: 10 (ref 5–15)
BUN: 10 mg/dL (ref 6–20)
CO2: 22 mmol/L (ref 22–32)
Calcium: 9.1 mg/dL (ref 8.9–10.3)
Chloride: 108 mmol/L (ref 98–111)
Creatinine, Ser: 0.57 mg/dL (ref 0.44–1.00)
GFR calc Af Amer: >60 mL/min (ref >60)
GFR calc non Af Amer: >60 mL/min (ref >60)
Glucose, Bld: 105 mg/dL — ABNORMAL HIGH (ref 70–99)
Potassium: 4.2 mmol/L (ref 3.5–5.1)
Sodium: 140 mmol/L (ref 135–145)
Total Bilirubin: 0.7 mg/dL (ref 0.3–1.2)
Total Protein: 7.2 g/dL (ref 6.5–8.1)

## 2018-07-03 LAB — I-STAT BETA HCG BLOOD, ED (MC, WL, AP ONLY): I-stat hCG, quantitative: <5 m[IU]/mL (ref <5)

## 2018-07-03 LAB — NOVEL CORONAVIRUS, NAA: SARS-CoV-2, NAA: NOT DETECTED

## 2018-07-03 LAB — LIPASE, BLOOD: Lipase: 28 U/L (ref 11–51)

## 2018-07-03 MED ORDER — POLYETHYLENE GLYCOL 3350 17 GM/SCOOP PO POWD: 17.0000 g | Freq: Every day | ORAL | 0 refills | Status: DC

## 2018-07-03 MED ORDER — FENTANYL CITRATE (PF) 100 MCG/2ML IJ SOLN
50.0000 ug | Freq: Once | INTRAMUSCULAR | Status: AC
  Administered 2018-07-03: 18:00:00 50 ug via INTRAVENOUS
  Filled 2018-07-03: qty 2

## 2018-07-03 MED ORDER — IOHEXOL 300 MG/ML  SOLN
100.0000 mL | Freq: Once | INTRAMUSCULAR | Status: AC | PRN
  Administered 2018-07-03: 100 mL via INTRAVENOUS

## 2018-07-03 MED ORDER — SODIUM CHLORIDE 0.9% FLUSH: 3.0000 mL | Freq: Once | INTRAVENOUS | Status: DC

## 2018-07-03 MED ORDER — HYDROCODONE-ACETAMINOPHEN 5-325 MG PO TABS: ORAL_TABLET | ORAL | 0 refills | Status: DC

## 2018-07-03 MED ORDER — DOCUSATE SODIUM 100 MG PO CAPS: 100.0000 mg | ORAL_CAPSULE | Freq: Two times a day (BID) | ORAL | 0 refills | Status: DC

## 2018-07-03 MED ORDER — ONDANSETRON 4 MG PO TBDP: 4.0000 mg | ORAL_TABLET | Freq: Three times a day (TID) | ORAL | 0 refills | Status: DC | PRN

## 2018-07-03 NOTE — Discharge Instructions (Signed)
Please read and follow all provided instructions.  Your diagnoses today include:  1. RLQ abdominal pain     Tests performed today include:  Blood counts and electrolytes  Blood tests to check liver and kidney function  Blood tests to check pancreas function  Urine test to look for infection and pregnancy (in women)  CT scan -no infections, blockages, or holes in the bowel however you do have a large amount of stool in the right side of your abdomen  Vital signs. See below for your results today.   Medications prescribed:   Miralax - laxative  This medication can be found over-the-counter.    Colace - stool softener  This medication can be found over-the-counter.   Continue colace for 2 weeks after your stools return to normal to prevent constipation.    Percocet (oxycodone/acetaminophen) - narcotic pain medication  DO NOT drive or perform any activities that require you to be awake and alert because this medicine can make you drowsy. BE VERY CAREFUL not to take multiple medicines containing Tylenol (also called acetaminophen). Doing so can lead to an overdose which can damage your liver and cause liver failure and possibly death.   Zofran (ondansetron) - for nausea and vomiting  Take any prescribed medications only as directed.  Home care instructions:   Follow any educational materials contained in this packet.  Follow-up instructions: Please follow-up with your primary care provider in the next 3 days for further evaluation of your symptoms.    Return instructions:  SEEK IMMEDIATE MEDICAL ATTENTION IF:  The pain does not go away or becomes severe   A temperature above 101F develops   Repeated vomiting occurs (multiple episodes)   The pain becomes localized to portions of the abdomen. The right side could possibly be appendicitis. In an adult, the left lower portion of the abdomen could be colitis or diverticulitis.   Blood is being passed in stools or  vomit (bright red or black tarry stools)   You develop chest pain, difficulty breathing, dizziness or fainting, or become confused, poorly responsive, or inconsolable (young children)  If you have any other emergent concerns regarding your health  Additional Information: Abdominal (belly) pain can be caused by many things. Your caregiver performed an examination and possibly ordered blood/urine tests and imaging (CT scan, x-rays, ultrasound). Many cases can be observed and treated at home after initial evaluation in the emergency department. Even though you are being discharged home, abdominal pain can be unpredictable. Therefore, you need a repeated exam if your pain does not resolve, returns, or worsens. Most patients with abdominal pain don't have to be admitted to the hospital or have surgery, but serious problems like appendicitis and gallbladder attacks can start out as nonspecific pain. Many abdominal conditions cannot be diagnosed in one visit, so follow-up evaluations are very important.  Your vital signs today were: BP 110/76 (BP Location: Left Arm)    Pulse 95    Temp 98.6 F (37 C) (Oral)    Resp 16    SpO2 97%  If your blood pressure (bp) was elevated above 135/85 this visit, please have this repeated by your doctor within one month. --------------

## 2018-07-03 NOTE — ED Provider Notes (Signed)
[facility]   CSN: [id number] Arrival date & time: 07/03/18  1531    History   Chief Complaint Chief Complaint  Patient presents with  . Abdominal Pain    HPI Amber Marsh is a 23 y.o. female.     HPI   23 year old female presents today with complaints of right lower quadrant abdominal pain.  Patient notes approximately 1 week ago she developed a generalized headache similar to previous.  She notes that she gets headaches often.  She denies any associated neurological deficits, neck stiffness.  Patient also notes around the same time she developed right lower quadrant abdominal pain.  She notes this feels similar to when she had ruptured appendicitis in 2018.  She denies any urinary symptoms, denies any vaginal discharge or bleeding.  She notes normal bowel movements, no vomiting but decreased appetite.  She notes she has not had anything solid to eat today but has been drinking fluids.  Past Medical History:  Diagnosis Date  . Allergy    seasonal allergy  . Appendicitis 05/2016  . Asthma    exercise induced    Patient Active Problem List   Diagnosis Date Noted  . Late menarche 02/05/2018  . Chronic pain of right knee 01/24/2018  . Obesity (BMI 30-39.9) 05/21/2017  . Seasonal allergies 05/21/2017  . Encounter for initial prescription of transdermal patch hormonal contraceptive device 05/21/2017  . Migraine without aura and without status migrainosus, not intractable 05/21/2017  . Tobacco use 05/21/2017  . Migraines 06/24/2016  . Bronchospasm 06/24/2016    Past Surgical History:  Procedure Laterality Date  . APPENDECTOMY  06/10/2016  . KNEE SURGERY Right 2018  . LAPAROSCOPIC APPENDECTOMY N/A 06/10/2016   Procedure: APPENDECTOMY LAPAROSCOPIC OPEN;  Surgeon: Harriette Bouillonornett, Thomas, MD;  Location: [facility] OR;  Service: General;  Laterality: N/A;     OB History    Gravida  0   Para  0   Term  0   Preterm  0   AB  0    Living  0     SAB  0   TAB  0   Ectopic  0   Multiple  0   Live Births  0            Home Medications    Prior to Admission medications   Medication Sig Start Date End Date Taking? Authorizing Provider  albuterol (PROVENTIL HFA;VENTOLIN HFA) 108 (90 Base) MCG/ACT inhaler Inhale 1-2 puffs into the lungs every 4 (four) hours as needed for wheezing or shortness of breath. 01/24/18  Yes Delynn FlavinGottschalk, Ashly M, DO  HYDROcodone-acetaminophen (NORCO/VICODIN) 5-325 MG tablet Take 1 tablet by mouth every 6 (six) hours as needed (for pain).  04/09/18  Yes [provider]  ibuprofen (ADVIL) 600 MG tablet Take 600 mg by mouth every 6 (six) hours as needed for headache or mild pain (or migraine-related pain).  04/09/18  Yes [provider]  loratadine (CLARITIN) 10 MG tablet Take 1 tablet (10 mg total) by mouth daily. 01/24/18  Yes Gottschalk, Kathie RhodesAshly M, DO  norelgestromin-ethinyl estradiol Burr Medico(XULANE) 150-35 MCG/24HR transdermal patch APPLY 1 PATCH ONCE A WEEK Patient taking differently: Place 1 patch onto the skin See admin instructions. Apply 1 patch transdermally once a week for 3 weeks, off on 4th week 01/24/18  Yes Gottschalk, Ashly M, DO  SUMAtriptan (IMITREX) 50 MG tablet Take 1 tablet (50 mg total) by mouth every 2 (two) hours as needed for migraine. May repeat  1x in 2 hours if headache persists or recurs. Patient taking differently: Take 50 mg by mouth every 2 (two) hours as needed for migraine (and may repeat ONCE in 2 hours if headache persists or recurs).  01/24/18  Yes Gottschalk, Ashly M, DO  UNKNOWN TO PATIENT Take 1 tablet by mouth See admin instructions. Anti-nausea tablet (name not recalled by the patient): Take 1 tablet by mouth every 8 hours as needed for nausea   Yes [provider]    Family History Family History  Problem Relation Age of Onset  . Migraines Mother   . Ovarian cysts Mother   . Hypertension Father   . Hypertension Maternal Grandmother    . Migraines Maternal Grandmother   . Cervical cancer Maternal Grandmother   . Diabetes Paternal Grandmother     Social History Social History   Tobacco Use  . Smoking status: Current Every Day Smoker    Packs/day: 0.50    Years: 3.00    Pack years: 1.50    Types: Cigarettes  . Smokeless tobacco: Never Used  Substance Use Topics  . Alcohol use: Yes    Comment: occasional  . Drug use: No     Allergies   Penicillins, Grapeseed extract [nutritional supplements], Morphine and related, Latex, and Tape   Review of Systems Review of Systems  All other systems reviewed and are negative.   Physical Exam Updated Vital Signs BP 110/76 (BP Location: Left Arm)   Pulse 95   Temp 98.6 F (37 C) (Oral)   Resp 16   SpO2 97%   Physical Exam Vitals signs and nursing note reviewed.  Constitutional:      Appearance: She is well-developed.  HENT:     Head: Normocephalic and atraumatic.  Eyes:     General: No scleral icterus.       Right eye: No discharge.        Left eye: No discharge.     Conjunctiva/sclera: Conjunctivae normal.     Pupils: Pupils are equal, round, and reactive to light.  Neck:     Musculoskeletal: Normal range of motion.     Vascular: No JVD.     Trachea: No tracheal deviation.  Pulmonary:     Effort: Pulmonary effort is normal.     Breath sounds: No stridor.  Abdominal:     Comments: Exquisite tenderness palpation of right lower quadrant remainder abdomen soft nontender  Neurological:     General: No focal deficit present.     Mental Status: She is alert and oriented to person, place, and time.     Cranial Nerves: No cranial nerve deficit.     Sensory: No sensory deficit.     Motor: No weakness.     Coordination: Coordination normal.  Psychiatric:        Behavior: Behavior normal.        Thought Content: Thought content normal.        Judgment: Judgment normal.      ED Treatments / Results  Labs (all labs ordered are listed, but only  abnormal results are displayed) Labs Reviewed  COMPREHENSIVE METABOLIC PANEL - Abnormal; Notable for the following components:      Result Value   Glucose, Bld 105 (*)    All other components within normal limits  CBC - Abnormal; Notable for the following components:   WBC 13.4 (*)    All other components within normal limits  LIPASE, BLOOD  URINALYSIS, ROUTINE W REFLEX MICROSCOPIC  I-STAT  BETA HCG BLOOD, ED ([facility], [facility], [facility] ONLY)    EKG None  Radiology No results found.  Procedures Procedures (including critical care time)  Medications Ordered in ED Medications  sodium chloride flush (NS) 0.9 % injection 3 mL (has no administration in time range)  fentaNYL (SUBLIMAZE) injection 50 mcg (50 mcg Intravenous Given 07/03/18 1802)     Initial Impression / Assessment and Plan / ED Course  I have reviewed the triage vital signs and the nursing notes.  Pertinent labs & imaging results that were available during my care of the patient were reviewed by me and considered in my medical decision making (see chart for details).          Assessment/Plan: 23 year old female presents today with right lower quadrant abdominal pain.  Low suspicion for pelvic etiology.  No vaginal discharge or bleeding.  Given the exquisite tenderness palpation of right lower abdomen CT abdomen pelvis would be ordered.  Patient care signed to oncoming provider pending CT results and disposition.      Final Clinical Impressions(s) / ED Diagnoses   Final diagnoses:  RLQ abdominal pain    ED Discharge Orders    None       Francee Gentile 07/03/18 1839    Isla Pence, MD 07/03/18 2314

## 2018-07-03 NOTE — Telephone Encounter (Signed)
Continue to stay home, Dr Lajuana Ripple have availability tomorrow

## 2018-07-03 NOTE — ED Triage Notes (Signed)
Pt reports RLQ pain with nausea and also has a headache.

## 2018-07-03 NOTE — Telephone Encounter (Signed)
Pt states her headache is terrible and stomach is hurting bad level 8 on pain scale of 0-10. Her father now has a fever and is going to get tested for COVID-19 now and they live together. Advised pt if her pain is level 8 on pain scale she should go to be evaluated at the ED or offered Televisit with provider tomorrow as we are full today. Pt states she would go to ED.

## 2018-07-03 NOTE — ED Provider Notes (Signed)
7:55 PM handoff from [place] PA-C at shift change.  Patient with prior appendectomy awaiting CT imaging due to significant right lower quadrant abdominal tenderness.  No pelvic symptoms.  Reviewed CT personally.  Patient has fecalization of GI contents into the small bowel and has significant stool in the right side of the abdomen.  This may be causing her pain.  No signs of bowel perforation, infection, obstruction noted.  Patient updated on results.  Will give patient additional pain medicine and fluid challenge here.  Anticipate discharged home with symptom control and MiraLAX.  8:42 PM PO challenged passed. Ready for d/c.   We discussed at length that abdominal pain can evolve and change over time.  The patient was urged to return to the Emergency Department immediately with worsening of current symptoms, worsening abdominal pain, persistent vomiting, blood noted in stools, fever, or any other concerns. The patient verbalized understanding.   BP 110/76 (BP Location: Left Arm)   Pulse 95   Temp 98.6 F (37 C) (Oral)   Resp 16   SpO2 97%   Patient counseled on use of narcotic pain medications. Counseled not to combine these medications with others containing tylenol. Urged not to drink alcohol, drive, or perform any other activities that requires focus while taking these medications. The patient verbalizes understanding and agrees with the plan.    Carlisle Cater, PA-C 07/03/18 2043    Blanchie Dessert, MD 07/03/18 2113

## 2018-07-07 ENCOUNTER — Encounter: Payer: Self-pay | Admitting: Family Medicine

## 2018-07-07 ENCOUNTER — Telehealth: Payer: Self-pay

## 2018-07-07 DIAGNOSIS — R110 Nausea: Secondary | ICD-10-CM | POA: Diagnosis not present

## 2018-07-07 DIAGNOSIS — K5900 Constipation, unspecified: Secondary | ICD-10-CM | POA: Diagnosis not present

## 2018-07-07 DIAGNOSIS — R1031 Right lower quadrant pain: Secondary | ICD-10-CM | POA: Diagnosis not present

## 2018-07-07 DIAGNOSIS — R109 Unspecified abdominal pain: Secondary | ICD-10-CM | POA: Diagnosis not present

## 2018-07-07 NOTE — Telephone Encounter (Signed)
Called patient to make sure she got her mychart message.  Advised patient to go back to the hospital and patient states she was going to.  Patient states she will set up a follow up once she gets out of the hospital.

## 2018-07-08 ENCOUNTER — Other Ambulatory Visit: Payer: Self-pay

## 2018-07-08 ENCOUNTER — Emergency Department (HOSPITAL_COMMUNITY)
Admission: EM | Admit: 2018-07-08 | Discharge: 2018-07-09 | Disposition: A | Discharge status: Home / Self Care | Payer: Medicaid Other | Source: Home / Self Care | Attending: Emergency Medicine | Admitting: Emergency Medicine

## 2018-07-08 ENCOUNTER — Encounter (HOSPITAL_COMMUNITY): Payer: Self-pay

## 2018-07-08 ENCOUNTER — Ambulatory Visit (INDEPENDENT_AMBULATORY_CARE_PROVIDER_SITE_OTHER): Payer: Medicaid Other | Admitting: Family Medicine

## 2018-07-08 ENCOUNTER — Emergency Department (HOSPITAL_COMMUNITY)
Admission: EM | Admit: 2018-07-08 | Discharge: 2018-07-08 | Disposition: A | Discharge status: Home / Self Care | Payer: Medicaid Other | Attending: Emergency Medicine | Admitting: Emergency Medicine

## 2018-07-08 ENCOUNTER — Encounter: Payer: Self-pay | Admitting: Family Medicine

## 2018-07-08 ENCOUNTER — Emergency Department (HOSPITAL_COMMUNITY): Payer: Medicaid Other

## 2018-07-08 DIAGNOSIS — K5900 Constipation, unspecified: Secondary | ICD-10-CM

## 2018-07-08 DIAGNOSIS — R1031 Right lower quadrant pain: Secondary | ICD-10-CM | POA: Diagnosis not present

## 2018-07-08 DIAGNOSIS — F17210 Nicotine dependence, cigarettes, uncomplicated: Secondary | ICD-10-CM | POA: Insufficient documentation

## 2018-07-08 DIAGNOSIS — Z79899 Other long term (current) drug therapy: Secondary | ICD-10-CM | POA: Diagnosis not present

## 2018-07-08 DIAGNOSIS — R1011 Right upper quadrant pain: Secondary | ICD-10-CM | POA: Diagnosis not present

## 2018-07-08 DIAGNOSIS — E860 Dehydration: Secondary | ICD-10-CM | POA: Diagnosis not present

## 2018-07-08 DIAGNOSIS — K625 Hemorrhage of anus and rectum: Secondary | ICD-10-CM

## 2018-07-08 DIAGNOSIS — Z91040 Latex allergy status: Secondary | ICD-10-CM | POA: Insufficient documentation

## 2018-07-08 DIAGNOSIS — J45909 Unspecified asthma, uncomplicated: Secondary | ICD-10-CM | POA: Insufficient documentation

## 2018-07-08 DIAGNOSIS — R109 Unspecified abdominal pain: Secondary | ICD-10-CM | POA: Diagnosis not present

## 2018-07-08 DIAGNOSIS — R112 Nausea with vomiting, unspecified: Secondary | ICD-10-CM | POA: Insufficient documentation

## 2018-07-08 DIAGNOSIS — Z880 Allergy status to penicillin: Secondary | ICD-10-CM | POA: Insufficient documentation

## 2018-07-08 DIAGNOSIS — R110 Nausea: Secondary | ICD-10-CM | POA: Diagnosis not present

## 2018-07-08 LAB — COMPREHENSIVE METABOLIC PANEL
ALT: 20 U/L (ref 0–44)
AST: 19 U/L (ref 15–41)
Albumin: 3.9 g/dL (ref 3.5–5.0)
Alkaline Phosphatase: 103 U/L (ref 38–126)
Anion gap: 8 (ref 5–15)
BUN: 6 mg/dL (ref 6–20)
CO2: 23 mmol/L (ref 22–32)
Calcium: 9.2 mg/dL (ref 8.9–10.3)
Chloride: 108 mmol/L (ref 98–111)
Creatinine, Ser: 0.62 mg/dL (ref 0.44–1.00)
GFR calc Af Amer: >60 mL/min (ref >60)
GFR calc non Af Amer: >60 mL/min (ref >60)
Glucose, Bld: 95 mg/dL (ref 70–99)
Potassium: 4.2 mmol/L (ref 3.5–5.1)
Sodium: 139 mmol/L (ref 135–145)
Total Bilirubin: 0.6 mg/dL (ref 0.3–1.2)
Total Protein: 6.6 g/dL (ref 6.5–8.1)

## 2018-07-08 LAB — CBC
HCT: 43.2 % (ref 36.0–46.0)
Hemoglobin: 14.2 g/dL (ref 12.0–15.0)
MCH: 28.8 pg (ref 26.0–34.0)
MCHC: 32.9 g/dL (ref 30.0–36.0)
MCV: 87.6 fL (ref 80.0–100.0)
Platelets: 265 10*3/uL (ref 150–400)
RBC: 4.93 MIL/uL (ref 3.87–5.11)
RDW: 12.7 % (ref 11.5–15.5)
WBC: 10 10*3/uL (ref 4.0–10.5)
nRBC: 0 % (ref 0.0–0.2)

## 2018-07-08 LAB — I-STAT BETA HCG BLOOD, ED (MC, WL, AP ONLY): I-stat hCG, quantitative: <5 m[IU]/mL (ref <5)

## 2018-07-08 LAB — LIPASE, BLOOD: Lipase: 20 U/L (ref 11–51)

## 2018-07-08 MED ORDER — SODIUM CHLORIDE 0.9 % IV BOLUS
1000.0000 mL | Freq: Once | INTRAVENOUS | Status: AC
  Administered 2018-07-08: 1000 mL via INTRAVENOUS

## 2018-07-08 MED ORDER — ONDANSETRON HCL 4 MG/2ML IJ SOLN
4.0000 mg | Freq: Once | INTRAMUSCULAR | Status: AC
  Administered 2018-07-08: 4 mg via INTRAVENOUS
  Filled 2018-07-08: qty 2

## 2018-07-08 MED ORDER — SODIUM CHLORIDE 0.9% FLUSH: 3.0000 mL | Freq: Once | INTRAVENOUS | Status: DC

## 2018-07-08 MED ORDER — POLYETHYLENE GLYCOL 3350 17 G PO PACK: 17.0000 g | PACK | Freq: Every day | ORAL | 0 refills | Status: DC

## 2018-07-08 MED ORDER — KETOROLAC TROMETHAMINE 30 MG/ML IJ SOLN
30.0000 mg | Freq: Once | INTRAMUSCULAR | Status: AC
  Administered 2018-07-08: 30 mg via INTRAVENOUS
  Filled 2018-07-08: qty 1

## 2018-07-08 NOTE — ED Provider Notes (Signed)
[facility] Provider Note   CSN: [id number] Arrival date & time: 07/08/18  1247    History   Chief Complaint Chief Complaint  Patient presents with  . Abdominal Pain    HPI Amber Marsh is a 23 y.o. female.     23 yo F with a chief complaints of right lower quadrant abdominal pain.  This is been an ongoing issue for her.  This is now her third ED visit in a week for the same.  At her initial visit here she had a CT scan of the abdomen pelvis that was negative for acute pathology.  It was presumed that she was constipated and she was discharged home.  The patient had a large bowel movement today that she describes as grossly bloody.  Still having her right lower quadrant abdominal pain.  Denies fevers or chills  The history is provided by the patient.  Abdominal Pain Pain location:  Generalized Pain radiates to:  Does not radiate Pain severity:  Moderate Onset quality:  Sudden Duration:  2 days Timing:  Constant Progression:  Worsening Chronicity:  New Relieved by:  Nothing Worsened by:  Nothing Ineffective treatments:  None tried Associated symptoms: no chest pain, no chills, no dysuria, no fever, no nausea, no shortness of breath and no vomiting     Past Medical History:  Diagnosis Date  . Allergy    seasonal allergy  . Appendicitis 05/2016  . Asthma    exercise induced    Patient Active Problem List   Diagnosis Date Noted  . Late menarche 02/05/2018  . Chronic pain of right knee 01/24/2018  . Obesity (BMI 30-39.9) 05/21/2017  . Seasonal allergies 05/21/2017  . Encounter for initial prescription of transdermal patch hormonal contraceptive device 05/21/2017  . Migraine without aura and without status migrainosus, not intractable 05/21/2017  . Tobacco use 05/21/2017  . Migraines 06/24/2016  . Bronchospasm 06/24/2016    Past Surgical History:  Procedure Laterality Date  . APPENDECTOMY  06/10/2016  . KNEE SURGERY  Right 2018  . LAPAROSCOPIC APPENDECTOMY N/A 06/10/2016   Procedure: APPENDECTOMY LAPAROSCOPIC OPEN;  Surgeon: Harriette Bouillonornett, Thomas, MD;  Location: [facility] OR;  Service: General;  Laterality: N/A;     OB History    Gravida  0   Para  0   Term  0   Preterm  0   AB  0   Living  0     SAB  0   TAB  0   Ectopic  0   Multiple  0   Live Births  0            Home Medications    Prior to Admission medications   Medication Sig Start Date End Date Taking? Authorizing Provider  albuterol (PROVENTIL HFA;VENTOLIN HFA) 108 (90 Base) MCG/ACT inhaler Inhale 1-2 puffs into the lungs every 4 (four) hours as needed for wheezing or shortness of breath. 01/24/18   Raliegh IpGottschalk, Ashly M, DO  docusate sodium (COLACE) 100 MG capsule Take 1 capsule (100 mg total) by mouth every 12 (twelve) hours. 07/03/18   Renne CriglerGeiple, Joshua, PA-C  ibuprofen (ADVIL) 600 MG tablet Take 600 mg by mouth every 6 (six) hours as needed for headache or mild pain (or migraine-related pain).  04/09/18   [provider]  loratadine (CLARITIN) 10 MG tablet Take 1 tablet (10 mg total) by mouth daily. 01/24/18   Raliegh IpGottschalk, Ashly M, DO  norelgestromin-ethinyl estradiol Burr Medico(XULANE) 150-35 MCG/24HR transdermal patch APPLY 1  PATCH ONCE A WEEK Patient taking differently: Place 1 patch onto the skin See admin instructions. Apply 1 patch transdermally once a week for 3 weeks, off on 4th week 01/24/18   Ronnie Doss M, DO  ondansetron (ZOFRAN ODT) 4 MG disintegrating tablet Take 1 tablet (4 mg total) by mouth every 8 (eight) hours as needed for nausea or vomiting. 07/03/18   Carlisle Cater, PA-C  polyethylene glycol (MIRALAX / GLYCOLAX) 17 g packet Take 17 g by mouth daily. 07/08/18   Deno Etienne, DO  SUMAtriptan (IMITREX) 50 MG tablet Take 1 tablet (50 mg total) by mouth every 2 (two) hours as needed for migraine. May repeat 1x in 2 hours if headache persists or recurs. Patient taking differently: Take 50 mg by mouth every 2 (two) hours as  needed for migraine (and may repeat ONCE in 2 hours if headache persists or recurs).  01/24/18   Ronnie Doss M, DO  UNKNOWN TO PATIENT Take 1 tablet by mouth See admin instructions. Anti-nausea tablet (name not recalled by the patient): Take 1 tablet by mouth every 8 hours as needed for nausea    [provider]    Family History Family History  Problem Relation Age of Onset  . Migraines Mother   . Ovarian cysts Mother   . Hypertension Father   . Hypertension Maternal Grandmother   . Migraines Maternal Grandmother   . Cervical cancer Maternal Grandmother   . Diabetes Paternal Grandmother     Social History Social History   Tobacco Use  . Smoking status: Current Every Day Smoker    Packs/day: 0.50    Years: 3.00    Pack years: 1.50    Types: Cigarettes  . Smokeless tobacco: Never Used  Substance Use Topics  . Alcohol use: Yes    Comment: occasional  . Drug use: No     Allergies   Penicillins, Grapeseed extract [nutritional supplements], Morphine and related, Latex, and Tape   Review of Systems Review of Systems  Constitutional: Negative for chills and fever.  HENT: Negative for congestion and rhinorrhea.   Eyes: Negative for redness and visual disturbance.  Respiratory: Negative for shortness of breath and wheezing.   Cardiovascular: Negative for chest pain and palpitations.  Gastrointestinal: Positive for abdominal pain and blood in stool. Negative for nausea and vomiting.  Genitourinary: Negative for dysuria and urgency.  Musculoskeletal: Negative for arthralgias and myalgias.  Skin: Negative for pallor and wound.  Neurological: Negative for dizziness and headaches.     Physical Exam Updated Vital Signs BP 108/70 (BP Location: Right Arm)   Pulse 87   Temp 98.2 F (36.8 C) (Oral)   Resp 16   SpO2 99%   Physical Exam Vitals signs and nursing note reviewed.  Constitutional:      General: She is not in acute distress.    Appearance: She is  well-developed. She is not diaphoretic.  HENT:     Head: Normocephalic and atraumatic.  Eyes:     Pupils: Pupils are equal, round, and reactive to light.  Neck:     Musculoskeletal: Normal range of motion and neck supple.  Cardiovascular:     Rate and Rhythm: Normal rate and regular rhythm.     Heart sounds: No murmur. No friction rub. No gallop.   Pulmonary:     Effort: Pulmonary effort is normal.     Breath sounds: No wheezing or rales.  Abdominal:     General: There is no distension.  Palpations: Abdomen is soft.     Tenderness: There is abdominal tenderness (mild RLQ).     Comments: Rectal exam with no stool in the vault no gross blood no hemorrhoids or fissure noted.  Musculoskeletal:        General: No tenderness.  Skin:    General: Skin is warm and dry.  Neurological:     Mental Status: She is alert and oriented to person, place, and time.  Psychiatric:        Behavior: Behavior normal.      ED Treatments / Results  Labs (all labs ordered are listed, but only abnormal results are displayed) Labs Reviewed  LIPASE, BLOOD  COMPREHENSIVE METABOLIC PANEL  CBC  URINALYSIS, ROUTINE W REFLEX MICROSCOPIC  I-STAT BETA HCG BLOOD, ED ([facility], [facility], [facility] ONLY)    EKG None  Radiology No results found.  Procedures Procedures (including critical care time)  Medications Ordered in ED Medications  sodium chloride flush (NS) 0.9 % injection 3 mL (has no administration in time range)     Initial Impression / Assessment and Plan / ED Course  I have reviewed the triage vital signs and the nursing notes.  Pertinent labs & imaging results that were available during my care of the patient were reviewed by me and considered in my medical decision making (see chart for details).        23 yo F with a chief complaint of ongoing right lower quadrant abdominal pain.  This is her third visit to the emergency department in a week's time.  She had a CT scan at onset that was  negative.  Patient has a benign abdominal exam here.  No leukocytosis no fever.  She has had a prior appendectomy.  I feel there is limited utility for repeat imaging.  Rectal exam without obvious source of bleeding.  Hemoglobin is stable.  No tachycardia no hypotension.  At this point will refer her for outpatient GI follow-up.  Have her try MiraLAX cleanout at home.  3:17 PM:  I have discussed the diagnosis/risks/treatment options with the patient and believe the pt to be eligible for discharge home to follow-up with PCP, GI. We also discussed returning to the ED immediately if new or worsening sx occur. We discussed the sx which are most concerning (e.g., sudden worsening pain, fever, inability to tolerate by mouth) that necessitate immediate return. Medications administered to the patient during their visit and any new prescriptions provided to the patient are listed below.  Medications given during this visit Medications  sodium chloride flush (NS) 0.9 % injection 3 mL (has no administration in time range)     The patient appears reasonably screen and/or stabilized for discharge and I doubt any other medical condition or other [facility] requiring further screening, evaluation, or treatment in the ED at this time prior to discharge.    Final Clinical Impressions(s) / ED Diagnoses   Final diagnoses:  BRBPR (bright red blood per rectum)  RLQ abdominal pain    ED Discharge Orders         Ordered    polyethylene glycol (MIRALAX / GLYCOLAX) 17 g packet  Daily     07/08/18 1504           Melene PlanFloyd, Koreena Joost, DO 07/08/18 1517

## 2018-07-08 NOTE — ED Triage Notes (Signed)
Pt was dx with constipation at [facility] today. but has not had any relief. [facility]o went to [facility]. Said she had an enema and had bloody diarrhea.

## 2018-07-08 NOTE — Discharge Instructions (Signed)
Take 8 scoops of miralax in 32oz of whatever you would like to drink.(Gatorade comes in this size) You can also use a fleets enema which you can buy over the counter at the pharmacy.  Return for worsening abdominal pain, vomiting or fever. ? ?

## 2018-07-08 NOTE — ED Notes (Signed)
Pt states that she is having severe pain and cannot eat

## 2018-07-08 NOTE — ED Provider Notes (Signed)
[facility] EMERGENCY DEPARTMENT Provider Note   CSN: [id number] Arrival date & time: 07/08/18  2207    History   Chief Complaint Chief Complaint  Patient presents with  . Abdominal Pain    Constipation     HPI Amber Marsh is a 23 y.o. female.     Pt presents to the ED today with continued abdominal pain, n/v, and constipation.  The pt had a ct scan on 6/18 of her abd/pelvis which showed constipation.  She said she was given an enema, but it did not work.  She has not passed any stool, but has passed some blood.  She said she feels nauseous and has no appetite.  She was here again this afternoon, but said they did not help her.  She had a rectal exam which was guaiac neg.  Pt has a rx for zofran which has not been helping.  She also has a rx for miralax which she has been unable to keep down.     Past Medical History:  Diagnosis Date  . Allergy    seasonal allergy  . Appendicitis 05/2016  . Asthma    exercise induced    Patient Active Problem List   Diagnosis Date Noted  . Late menarche 02/05/2018  . Chronic pain of right knee 01/24/2018  . Obesity (BMI 30-39.9) 05/21/2017  . Seasonal allergies 05/21/2017  . Encounter for initial prescription of transdermal patch hormonal contraceptive device 05/21/2017  . Migraine without aura and without status migrainosus, not intractable 05/21/2017  . Tobacco use 05/21/2017  . Migraines 06/24/2016  . Bronchospasm 06/24/2016    Past Surgical History:  Procedure Laterality Date  . APPENDECTOMY  06/10/2016  . KNEE SURGERY Right 2018  . LAPAROSCOPIC APPENDECTOMY N/A 06/10/2016   Procedure: APPENDECTOMY LAPAROSCOPIC OPEN;  Surgeon: Harriette Bouillonornett, Thomas, MD;  Location: [facility] OR;  Service: General;  Laterality: N/A;     OB History    Gravida  0   Para  0   Term  0   Preterm  0   AB  0   Living  0     SAB  0   TAB  0   Ectopic  0   Multiple  0   Live Births  0            Home Medications    Prior to  Admission medications   Medication Sig Start Date End Date Taking? Authorizing Provider  albuterol (PROVENTIL HFA;VENTOLIN HFA) 108 (90 Base) MCG/ACT inhaler Inhale 1-2 puffs into the lungs every 4 (four) hours as needed for wheezing or shortness of breath. 01/24/18   Raliegh IpGottschalk, Ashly M, DO  dicyclomine (BENTYL) 20 MG tablet Take 1 tablet (20 mg total) by mouth 2 (two) times daily. 07/09/18   Jacalyn LefevreHaviland, Benz Vandenberghe, MD  docusate sodium (COLACE) 100 MG capsule Take 1 capsule (100 mg total) by mouth every 12 (twelve) hours. 07/03/18   Renne CriglerGeiple, Joshua, PA-C  ibuprofen (ADVIL) 600 MG tablet Take 600 mg by mouth every 6 (six) hours as needed for headache or mild pain (or migraine-related pain).  04/09/18   [provider]  loratadine (CLARITIN) 10 MG tablet Take 1 tablet (10 mg total) by mouth daily. 01/24/18   Raliegh IpGottschalk, Ashly M, DO  norelgestromin-ethinyl estradiol Burr Medico(XULANE) 150-35 MCG/24HR transdermal patch APPLY 1 PATCH ONCE A WEEK Patient taking differently: Place 1 patch onto the skin See admin instructions. Apply 1 patch transdermally once a week for 3 weeks, off on 4th week 01/24/18  Ronnie Doss M, DO  ondansetron (ZOFRAN ODT) 4 MG disintegrating tablet Take 1 tablet (4 mg total) by mouth every 8 (eight) hours as needed for nausea or vomiting. 07/03/18   Carlisle Cater, PA-C  polyethylene glycol (MIRALAX / GLYCOLAX) 17 g packet Take 17 g by mouth daily. 07/08/18   Deno Etienne, DO  promethazine (PHENERGAN) 25 MG suppository Place 1 suppository (25 mg total) rectally every 6 (six) hours as needed for nausea or vomiting. 07/09/18   Isla Pence, MD  SUMAtriptan (IMITREX) 50 MG tablet Take 1 tablet (50 mg total) by mouth every 2 (two) hours as needed for migraine. May repeat 1x in 2 hours if headache persists or recurs. Patient taking differently: Take 50 mg by mouth every 2 (two) hours as needed for migraine (and may repeat ONCE in 2 hours if headache persists or recurs).  01/24/18   Ronnie Doss  M, DO  UNKNOWN TO PATIENT Take 1 tablet by mouth See admin instructions. Anti-nausea tablet (name not recalled by the patient): Take 1 tablet by mouth every 8 hours as needed for nausea    [provider]    Family History Family History  Problem Relation Age of Onset  . Migraines Mother   . Ovarian cysts Mother   . Hypertension Father   . Hypertension Maternal Grandmother   . Migraines Maternal Grandmother   . Cervical cancer Maternal Grandmother   . Diabetes Paternal Grandmother     Social History Social History   Tobacco Use  . Smoking status: Current Every Day Smoker    Packs/day: 0.50    Years: 3.00    Pack years: 1.50    Types: Cigarettes  . Smokeless tobacco: Never Used  Substance Use Topics  . Alcohol use: Yes    Comment: occasional  . Drug use: No     Allergies   Penicillins, Grapeseed extract [nutritional supplements], Morphine and related, Latex, and Tape   Review of Systems Review of Systems  Gastrointestinal: Positive for abdominal pain, blood in stool, constipation, nausea and vomiting.  All other systems reviewed and are negative.    Physical Exam Updated Vital Signs BP 114/75 (BP Location: Right Arm)   Pulse 94   Temp 98.5 F (36.9 C) (Oral)   Resp 18   Ht 5\' 2"  (1.575 m)   Wt 90.7 kg   SpO2 97%   BMI 36.57 kg/m   Physical Exam Vitals signs and nursing note reviewed.  Constitutional:      Appearance: She is well-developed.  HENT:     Head: Normocephalic and atraumatic.     Mouth/Throat:     Mouth: Mucous membranes are moist.     Pharynx: Oropharynx is clear.  Eyes:     Extraocular Movements: Extraocular movements intact.     Pupils: Pupils are equal, round, and reactive to light.  Cardiovascular:     Rate and Rhythm: Normal rate and regular rhythm.  Pulmonary:     Effort: Pulmonary effort is normal.     Breath sounds: Normal breath sounds.  Abdominal:     General: Abdomen is flat. Bowel sounds are decreased.      Palpations: Abdomen is soft.  Skin:    General: Skin is warm.     Capillary Refill: Capillary refill takes less than 2 seconds.  Neurological:     General: No focal deficit present.     Mental Status: She is alert and oriented to person, place, and time.  Psychiatric:  Mood and Affect: Mood normal.        Behavior: Behavior normal.      ED Treatments / Results  Labs (all labs ordered are listed, but only abnormal results are displayed) Labs Reviewed  CBC WITH DIFFERENTIAL/PLATELET - Abnormal; Notable for the following components:      Result Value   WBC 10.9 (*)    All other components within normal limits  COMPREHENSIVE METABOLIC PANEL - Abnormal; Notable for the following components:   Calcium 8.8 (*)    All other components within normal limits  LIPASE, BLOOD    EKG None  Radiology Dg Abdomen Acute W/chest  Result Date: 07/09/2018 CLINICAL DATA:  Constipation EXAM: DG ABDOMEN ACUTE W/ 1V CHEST COMPARISON:  None. FINDINGS: There is no evidence of dilated bowel loops or free intraperitoneal air. No radiopaque calculi or other significant radiographic abnormality is seen. Heart size and mediastinal contours are within normal limits. Both lungs are clear. There is a moderate amount of stool in the colon. IMPRESSION: 1. No acute cardiopulmonary process. 2. Nonobstructive bowel gas pattern. 3. Moderate amount of stool in the colon. Electronically Signed   By: Katherine Mantlehristopher  Green M.D.   On: 07/09/2018 00:21    Procedures Procedures (including critical care time)  Medications Ordered in ED Medications  dicyclomine (BENTYL) injection 20 mg (has no administration in time range)  sodium chloride 0.9 % bolus 1,000 mL (0 mLs Intravenous Stopped 07/09/18 0148)  ketorolac (TORADOL) 30 MG/ML injection 30 mg (30 mg Intravenous Given 07/08/18 2340)  ondansetron (ZOFRAN) injection 4 mg (4 mg Intravenous Given 07/08/18 2340)  sorbitol, milk of mag, mineral oil, glycerin (SMOG) enema  (960 mLs Rectal Given 07/09/18 0126)     Initial Impression / Assessment and Plan / ED Course  I have reviewed the triage vital signs and the nursing notes.  Pertinent labs & imaging results that were available during my care of the patient were reviewed by me and considered in my medical decision making (see chart for details).   The nurse attempted to give pt an enema.  Unfortunately, pt was unable to tolerate the enema.  She could hold very little without getting cramps and getting up to the bathroom.    Pt will be d/c home with phenergan and bentyl.  She is told to take the miralax.     Final Clinical Impressions(s) / ED Diagnoses   Final diagnoses:  Constipation, unspecified constipation type  Right lower quadrant abdominal pain  Non-intractable vomiting with nausea, unspecified vomiting type    ED Discharge Orders         Ordered    promethazine (PHENERGAN) 25 MG suppository  Every 6 hours PRN     07/09/18 0202    dicyclomine (BENTYL) 20 MG tablet  2 times daily     07/09/18 0202           Jacalyn LefevreHaviland, Lewis Keats, MD 07/09/18 0202

## 2018-07-08 NOTE — Progress Notes (Signed)
Telephone visit  Subjective: CC: ED follow up RLQ pain PCP: Janora Norlander, DO JJK:[id number] Navejas is a 23 y.o. female calls for telephone consult today. Patient provides verbal consent for consult held via phone.  Location of patient: home Location of provider: Working remotely from home Others present for call: none  1. RLQ pain Patient was seen in the emergency department on 07/03/2018 for severe right lower quadrant pain.  CT abdomen pelvis was obtained which demonstrated stool on the right side of the abdomen.  She was discharged home with pain medication and MiraLAX.  She notes she never took the Norco because she was afraid of being more constipated.  She did complete the entire bottle of MiraLAX but still produced no bowel movement.  She reports ongoing severe abdominal pain, nausea and vomiting.  She took her last Zofran last evening.  The pain became worse yesterday and she sought emergent care in the emergency department at [facility] in [place].  She notes that she was found to be dehydrated.  They attempted enema and manual fecal disimpaction but notes that this was unsuccessful.  She was discharged this morning with instructions to hydrate, continue use of Zofran and try to increase fiber.  She still has not had a bowel movement and now notes that she is having quite a bit of rectal bleeding that is filling up the toilet.  She has not been able to hydrate secondary to nausea and malaise.   ROS: Per HPI  Allergies  Allergen Reactions  . Penicillins Other (See Comments)    From childhood: Did it involve swelling of the face/tongue/throat, SOB, or low BP? Unk Did it involve sudden or severe rash/hives, skin peeling, or any reaction on the inside of your mouth or nose? Unk Did you need to seek medical attention at a hospital or doctor's office? Yes When did it last happen? Childhood If all above answers are "NO", may proceed with cephalosporin use.    Lorenza Chick  Extract [Nutritional Supplements] Nausea And Vomiting  . Morphine And Related Hypertension  . Latex Rash  . Tape Rash    Has needed Benadryl afterwards   Past Medical History:  Diagnosis Date  . Allergy    seasonal allergy  . Appendicitis 05/2016  . Asthma    exercise induced    Current Outpatient Medications:  .  albuterol (PROVENTIL HFA;VENTOLIN HFA) 108 (90 Base) MCG/ACT inhaler, Inhale 1-2 puffs into the lungs every 4 (four) hours as needed for wheezing or shortness of breath., Disp: 1 Inhaler, Rfl: 1 .  docusate sodium (COLACE) 100 MG capsule, Take 1 capsule (100 mg total) by mouth every 12 (twelve) hours., Disp: 20 capsule, Rfl: 0 .  HYDROcodone-acetaminophen (NORCO/VICODIN) 5-325 MG tablet, Take 1-2 tablets every 6 hours as needed for severe pain, Disp: 5 tablet, Rfl: 0 .  ibuprofen (ADVIL) 600 MG tablet, Take 600 mg by mouth every 6 (six) hours as needed for headache or mild pain (or migraine-related pain). , Disp: , Rfl:  .  loratadine (CLARITIN) 10 MG tablet, Take 1 tablet (10 mg total) by mouth daily., Disp: 90 tablet, Rfl: 4 .  norelgestromin-ethinyl estradiol (XULANE) 150-35 MCG/24HR transdermal patch, APPLY 1 PATCH ONCE A WEEK (Patient taking differently: Place 1 patch onto the skin See admin instructions. Apply 1 patch transdermally once a week for 3 weeks, off on 4th week), Disp: 3 patch, Rfl: 12 .  ondansetron (ZOFRAN ODT) 4 MG disintegrating tablet, Take 1 tablet (4 mg total) by  mouth every 8 (eight) hours as needed for nausea or vomiting., Disp: 10 tablet, Rfl: 0 .  polyethylene glycol powder (GLYCOLAX/MIRALAX) 17 GM/SCOOP powder, Take 17 g by mouth daily., Disp: 255 g, Rfl: 0 .  SUMAtriptan (IMITREX) 50 MG tablet, Take 1 tablet (50 mg total) by mouth every 2 (two) hours as needed for migraine. May repeat 1x in 2 hours if headache persists or recurs. (Patient taking differently: Take 50 mg by mouth every 2 (two) hours as needed for migraine (and may repeat ONCE in 2 hours  if headache persists or recurs). ), Disp: 10 tablet, Rfl: 1 .  UNKNOWN TO PATIENT, Take 1 tablet by mouth See admin instructions. Anti-nausea tablet (name not recalled by the patient): Take 1 tablet by mouth every 8 hours as needed for nausea, Disp: , Rfl:   Assessment/ Plan: 23 y.o. female   1. Rectal bleeding Concern for possible tear given recent manual disimpaction and enema.  Given ongoing constipation and inability to stay hydrated due to severe nausea and malaise I have recommended she seek emergent medical [facility].  We will contact the emergency department with concerns.  I think at minimum she needs admission for observation but may benefit from inpatient cleanout.  I reviewed her CT abdomen pelvis from 07/03/2018 which demonstrated no SBO.  She had a repeat at [facility] which I am attempting to get her records from.  2. Dehydration  3. Constipation, unspecified constipation type  Start time: 11:08am End time: 11:19am  Total time spent on patient care (including telephone call/ virtual visit): 18 minutes  Ashly Hulen SkainsM Gottschalk, DO [facility] [phone]

## 2018-07-08 NOTE — Addendum Note (Signed)
Addended by: Janora Norlander on: 07/08/2018 04:03 PM   Modules accepted: Orders

## 2018-07-08 NOTE — ED Triage Notes (Signed)
Pt arrives to ED with c/o of RLQ pain that she has had since been seen in the ED on 8/18 and diagnosed with constipation but was placed on miralax and sent home- [facility] and [facility]a which she states since then she has had bloody diarrhea with the same pain present.

## 2018-07-09 ENCOUNTER — Encounter: Payer: Self-pay | Admitting: Family Medicine

## 2018-07-09 LAB — COMPREHENSIVE METABOLIC PANEL
ALT: 17 U/L (ref 0–44)
AST: 18 U/L (ref 15–41)
Albumin: 3.7 g/dL (ref 3.5–5.0)
Alkaline Phosphatase: 81 U/L (ref 38–126)
Anion gap: 8 (ref 5–15)
BUN: 9 mg/dL (ref 6–20)
CO2: 26 mmol/L (ref 22–32)
Calcium: 8.8 mg/dL — ABNORMAL LOW (ref 8.9–10.3)
Chloride: 107 mmol/L (ref 98–111)
Creatinine, Ser: 0.63 mg/dL (ref 0.44–1.00)
GFR calc Af Amer: >60 mL/min (ref >60)
GFR calc non Af Amer: >60 mL/min (ref >60)
Glucose, Bld: 92 mg/dL (ref 70–99)
Potassium: 3.9 mmol/L (ref 3.5–5.1)
Sodium: 141 mmol/L (ref 135–145)
Total Bilirubin: 0.6 mg/dL (ref 0.3–1.2)
Total Protein: 6.6 g/dL (ref 6.5–8.1)

## 2018-07-09 LAB — CBC WITH DIFFERENTIAL/PLATELET
Abs Immature Granulocytes: 0.02 10*3/uL (ref 0.00–0.07)
Basophils Absolute: 0 10*3/uL (ref 0.0–0.1)
Basophils Relative: 0 %
Eosinophils Absolute: 0.1 10*3/uL (ref 0.0–0.5)
Eosinophils Relative: 1 %
HCT: 41.4 % (ref 36.0–46.0)
Hemoglobin: 13.1 g/dL (ref 12.0–15.0)
Immature Granulocytes: 0 %
Lymphocytes Relative: 32 %
Lymphs Abs: 3.5 10*3/uL (ref 0.7–4.0)
MCH: 28.4 pg (ref 26.0–34.0)
MCHC: 31.6 g/dL (ref 30.0–36.0)
MCV: 89.8 fL (ref 80.0–100.0)
Monocytes Absolute: 0.8 10*3/uL (ref 0.1–1.0)
Monocytes Relative: 8 %
Neutro Abs: 6.4 10*3/uL (ref 1.7–7.7)
Neutrophils Relative %: 59 %
Platelets: 254 10*3/uL (ref 150–400)
RBC: 4.61 MIL/uL (ref 3.87–5.11)
RDW: 12.9 % (ref 11.5–15.5)
WBC: 10.9 10*3/uL — ABNORMAL HIGH (ref 4.0–10.5)
nRBC: 0 % (ref 0.0–0.2)

## 2018-07-09 LAB — LIPASE, BLOOD: Lipase: 22 U/L (ref 11–51)

## 2018-07-09 MED ORDER — PROMETHAZINE HCL 25 MG RE SUPP: 25.0000 mg | Freq: Four times a day (QID) | RECTAL | 0 refills | Status: DC | PRN

## 2018-07-09 MED ORDER — DICYCLOMINE HCL 20 MG PO TABS: 20.0000 mg | ORAL_TABLET | Freq: Two times a day (BID) | ORAL | 0 refills | Status: DC

## 2018-07-09 MED ORDER — SORBITOL 70 % SOLN
960.0000 mL | TOPICAL_OIL | Freq: Once | ORAL | Status: AC
  Administered 2018-07-09: 960 mL via RECTAL
  Filled 2018-07-09: qty 473

## 2018-07-09 MED ORDER — DICYCLOMINE HCL 10 MG/ML IM SOLN
20.0000 mg | Freq: Once | INTRAMUSCULAR | Status: AC
  Administered 2018-07-09: 02:00:00 20 mg via INTRAMUSCULAR
  Filled 2018-07-09: qty 2

## 2018-07-09 NOTE — ED Notes (Signed)
Pt is able to hold very little enema at a time due to cramping. Pt has had very little results.

## 2018-07-10 ENCOUNTER — Other Ambulatory Visit: Payer: Self-pay | Admitting: Family Medicine

## 2018-07-10 DIAGNOSIS — R1031 Right lower quadrant pain: Secondary | ICD-10-CM | POA: Diagnosis not present

## 2018-07-10 DIAGNOSIS — K5901 Slow transit constipation: Secondary | ICD-10-CM | POA: Diagnosis not present

## 2018-07-13 ENCOUNTER — Other Ambulatory Visit: Payer: Self-pay

## 2018-07-13 ENCOUNTER — Emergency Department (HOSPITAL_COMMUNITY): Payer: Medicaid Other

## 2018-07-13 ENCOUNTER — Emergency Department (HOSPITAL_COMMUNITY)
Admission: EM | Admit: 2018-07-13 | Discharge: 2018-07-13 | Disposition: A | Discharge status: Home / Self Care | Payer: Medicaid Other | Attending: Emergency Medicine | Admitting: Emergency Medicine

## 2018-07-13 ENCOUNTER — Encounter (HOSPITAL_COMMUNITY): Payer: Self-pay

## 2018-07-13 DIAGNOSIS — Z91040 Latex allergy status: Secondary | ICD-10-CM | POA: Insufficient documentation

## 2018-07-13 DIAGNOSIS — R112 Nausea with vomiting, unspecified: Secondary | ICD-10-CM | POA: Diagnosis not present

## 2018-07-13 DIAGNOSIS — K5900 Constipation, unspecified: Secondary | ICD-10-CM | POA: Insufficient documentation

## 2018-07-13 DIAGNOSIS — R109 Unspecified abdominal pain: Secondary | ICD-10-CM | POA: Diagnosis not present

## 2018-07-13 DIAGNOSIS — J45909 Unspecified asthma, uncomplicated: Secondary | ICD-10-CM | POA: Diagnosis not present

## 2018-07-13 DIAGNOSIS — F17210 Nicotine dependence, cigarettes, uncomplicated: Secondary | ICD-10-CM | POA: Diagnosis not present

## 2018-07-13 HISTORY — DX: Constipation, unspecified: K59.00

## 2018-07-13 LAB — URINALYSIS, ROUTINE W REFLEX MICROSCOPIC
Bilirubin Urine: NEGATIVE
Glucose, UA: NEGATIVE mg/dL
Hgb urine dipstick: NEGATIVE
Ketones, ur: 5 mg/dL — AB
Leukocytes,Ua: NEGATIVE
Nitrite: NEGATIVE
Protein, ur: NEGATIVE mg/dL
Specific Gravity, Urine: 1.018 (ref 1.005–1.030)
pH: 6 (ref 5.0–8.0)

## 2018-07-13 LAB — CBC WITH DIFFERENTIAL/PLATELET
Abs Immature Granulocytes: 0.03 10*3/uL (ref 0.00–0.07)
Basophils Absolute: 0 10*3/uL (ref 0.0–0.1)
Basophils Relative: 0 %
Eosinophils Absolute: 0.1 10*3/uL (ref 0.0–0.5)
Eosinophils Relative: 1 %
HCT: 41.6 % (ref 36.0–46.0)
Hemoglobin: 13.4 g/dL (ref 12.0–15.0)
Immature Granulocytes: 0 %
Lymphocytes Relative: 26 %
Lymphs Abs: 2.5 10*3/uL (ref 0.7–4.0)
MCH: 28.5 pg (ref 26.0–34.0)
MCHC: 32.2 g/dL (ref 30.0–36.0)
MCV: 88.3 fL (ref 80.0–100.0)
Monocytes Absolute: 0.8 10*3/uL (ref 0.1–1.0)
Monocytes Relative: 8 %
Neutro Abs: 6.2 10*3/uL (ref 1.7–7.7)
Neutrophils Relative %: 65 %
Platelets: 228 10*3/uL (ref 150–400)
RBC: 4.71 MIL/uL (ref 3.87–5.11)
RDW: 13 % (ref 11.5–15.5)
WBC: 9.7 10*3/uL (ref 4.0–10.5)
nRBC: 0 % (ref 0.0–0.2)

## 2018-07-13 LAB — BASIC METABOLIC PANEL
Anion gap: 8 (ref 5–15)
BUN: 10 mg/dL (ref 6–20)
CO2: 26 mmol/L (ref 22–32)
Calcium: 9.1 mg/dL (ref 8.9–10.3)
Chloride: 103 mmol/L (ref 98–111)
Creatinine, Ser: 0.66 mg/dL (ref 0.44–1.00)
GFR calc Af Amer: >60 mL/min (ref >60)
GFR calc non Af Amer: >60 mL/min (ref >60)
Glucose, Bld: 100 mg/dL — ABNORMAL HIGH (ref 70–99)
Potassium: 3.5 mmol/L (ref 3.5–5.1)
Sodium: 137 mmol/L (ref 135–145)

## 2018-07-13 LAB — I-STAT BETA HCG BLOOD, ED (MC, WL, AP ONLY): I-stat hCG, quantitative: <5 m[IU]/mL (ref <5)

## 2018-07-13 MED ORDER — SODIUM CHLORIDE 0.9 % IV BOLUS
1000.0000 mL | Freq: Once | INTRAVENOUS | Status: AC
  Administered 2018-07-13: 20:00:00 1000 mL via INTRAVENOUS

## 2018-07-13 MED ORDER — BISACODYL 10 MG RE SUPP
10.0000 mg | Freq: Once | RECTAL | Status: AC
  Administered 2018-07-13: 10 mg via RECTAL
  Filled 2018-07-13: qty 1

## 2018-07-13 MED ORDER — ONDANSETRON 8 MG PO TBDP
8.0000 mg | ORAL_TABLET | Freq: Once | ORAL | Status: AC
  Administered 2018-07-13: 8 mg via ORAL
  Filled 2018-07-13: qty 1

## 2018-07-13 NOTE — Discharge Instructions (Addendum)
Continue taking over-the-counter MiraLAX one capful mixed in at least 8 to 10 ounces of water or juice up to 3-4 times per day as needed for constipation.  It is important that you drink plenty of water, increase your dietary fiber with fruits and vegetables and exercise.  These things will help prevent recurrent constipation.  Follow-up with your primary doctor or with your gastroenterologist if needed.

## 2018-07-13 NOTE — ED Notes (Signed)
Education:    Fiber in diet Awareness of BM, when, consistency, increase fluids, fiber, meds OTC for same  Pt reports that she has had "Water" after mag citrate and is educated of fluids going around hard stool   She reports she has not gotten any stool softener as prescribed yet- she is educated regarding stool softeners and the benefit of less painful BM with soft stool rather than hard stool and the need for potential disimpaction

## 2018-07-13 NOTE — ED Triage Notes (Signed)
Pt presents to ED with complaints of right sided abdominal pain, unable to keep anything down, and constipation x 2 weeks. Pt has been seen at [facility] and [facility]

## 2018-07-13 NOTE — ED Notes (Signed)
Pt reports last BM 5 days ago has seen 3 different ED physicians   Was given rx for miralax  Spoke to GI doc who instructed to take mag citrate which she has taken x 2   Took anti-spasmotic for her discomfort   Here for her constipation issues- has not taken colace or stool softeners in the last 5 days

## 2018-07-13 NOTE — ED Notes (Signed)
Awaiting labs and rad

## 2018-07-13 NOTE — ED Notes (Signed)
Pt reports has been to 3 different hospitals for her chronic constipation

## 2018-07-13 NOTE — ED Notes (Signed)
TT in to reassess 

## 2018-07-14 ENCOUNTER — Encounter: Payer: Self-pay | Admitting: Gastroenterology

## 2018-07-14 ENCOUNTER — Encounter: Payer: Self-pay | Admitting: Family Medicine

## 2018-07-15 ENCOUNTER — Encounter: Payer: Self-pay | Admitting: Family Medicine

## 2018-07-15 ENCOUNTER — Other Ambulatory Visit: Payer: Self-pay | Admitting: Family Medicine

## 2018-07-15 NOTE — ED Provider Notes (Signed)
[facility] EMERGENCY DEPARTMENT Provider Note   CSN: [id number] Arrival date & time: 07/13/18  1832     History   Chief Complaint Chief Complaint  Patient presents with  . Abdominal Pain    HPI Amber Marsh is a 23 y.o. female.     HPI   Amber Marsh is a 23 y.o. female with previous appendectomy presents to the Emergency Department complaining of persistent constipation, nausea,vomiting and lower abdominal pain.  She has been seen multiple times at several ER's for same symptoms.  Seen here on 07/08/18 and had plain film and CT abd/pelvis on visit from 07/03/18.  Last visit showed moderate stool burden and nml bowel gas pattern.  She reports feeling dull pain to her lower abdomen and bloating.  She has tried Miralax and magnesium citrate.  She has some watery diarrhea after taking the Mag citrate, but feels that she is still constipated.  Symptoms worse with intake of solid foods.  She denies fever, chills, abdominal distention, dysuria, and back pain.   Past Medical History:  Diagnosis Date  . Allergy    seasonal allergy  . Appendicitis 05/2016  . Asthma    exercise induced  . Constipation     Patient Active Problem List   Diagnosis Date Noted  . Late menarche 02/05/2018  . Chronic pain of right knee 01/24/2018  . Obesity (BMI 30-39.9) 05/21/2017  . Seasonal allergies 05/21/2017  . Encounter for initial prescription of transdermal patch hormonal contraceptive device 05/21/2017  . Migraine without aura and without status migrainosus, not intractable 05/21/2017  . Tobacco use 05/21/2017  . Migraines 06/24/2016  . Bronchospasm 06/24/2016    Past Surgical History:  Procedure Laterality Date  . APPENDECTOMY  06/10/2016  . KNEE SURGERY Right 2018  . LAPAROSCOPIC APPENDECTOMY N/A 06/10/2016   Procedure: APPENDECTOMY LAPAROSCOPIC OPEN;  Surgeon: Erroll Luna, MD;  Location: [place];  Service: General;  Laterality: N/A;     OB History    Gravida  0   Para  0    Term  0   Preterm  0   AB  0   Living  0     SAB  0   TAB  0   Ectopic  0   Multiple  0   Live Births  0            Home Medications    Prior to Admission medications   Medication Sig Start Date End Date Taking? Authorizing Provider  albuterol (PROVENTIL HFA;VENTOLIN HFA) 108 (90 Base) MCG/ACT inhaler Inhale 1-2 puffs into the lungs every 4 (four) hours as needed for wheezing or shortness of breath. 01/24/18  Yes Gottschalk, Leatrice Jewels M, DO  dicyclomine (BENTYL) 20 MG tablet Take 1 tablet (20 mg total) by mouth 2 (two) times daily. 07/09/18  Yes Isla Pence, MD  docusate sodium (COLACE) 100 MG capsule Take 1 capsule (100 mg total) by mouth every 12 (twelve) hours. 07/03/18  Yes Carlisle Cater, PA-C  ibuprofen (ADVIL) 600 MG tablet Take 600 mg by mouth every 6 (six) hours as needed for headache or mild pain (or migraine-related pain).  04/09/18  Yes [provider]  loratadine (CLARITIN) 10 MG tablet Take 1 tablet (10 mg total) by mouth daily. 01/24/18  Yes Gottschalk, Ashly M, DO  ondansetron (ZOFRAN ODT) 4 MG disintegrating tablet Take 1 tablet (4 mg total) by mouth every 8 (eight) hours as needed for nausea or vomiting. 07/03/18  Yes Carlisle Cater, PA-C  polyethylene glycol (  MIRALAX / GLYCOLAX) 17 g packet Take 17 g by mouth daily. 07/08/18  Yes Melene PlanFloyd, Dan, DO  SUMAtriptan (IMITREX) 50 MG tablet Take 1 tablet (50 mg total) by mouth every 2 (two) hours as needed for migraine. May repeat 1x in 2 hours if headache persists or recurs. Patient taking differently: Take 50 mg by mouth every 2 (two) hours as needed for migraine (and may repeat ONCE in 2 hours if headache persists or recurs).  01/24/18  Yes Gottschalk, Kathie RhodesAshly M, DO  norelgestromin-ethinyl estradiol Burr Medico(XULANE) 150-35 MCG/24HR transdermal patch APPLY 1 PATCH ONCE A WEEK Patient taking differently: Place 1 patch onto the skin See admin instructions. Apply 1 patch transdermally once a week for 3 weeks, off on 4th week  01/24/18   Delynn FlavinGottschalk, Ashly M, DO  promethazine (PHENERGAN) 25 MG suppository Place 1 suppository (25 mg total) rectally every 6 (six) hours as needed for nausea or vomiting. 07/09/18   Jacalyn LefevreHaviland, Julie, MD    Family History Family History  Problem Relation Age of Onset  . Migraines Mother   . Ovarian cysts Mother   . Hypertension Father   . Hypertension Maternal Grandmother   . Migraines Maternal Grandmother   . Cervical cancer Maternal Grandmother   . Diabetes Paternal Grandmother     Social History Social History   Tobacco Use  . Smoking status: Current Every Day Smoker    Packs/day: 0.50    Years: 3.00    Pack years: 1.50    Types: Cigarettes  . Smokeless tobacco: Never Used  Substance Use Topics  . Alcohol use: Yes    Comment: occasional  . Drug use: No     Allergies   Penicillins, Grapeseed extract [nutritional supplements], Morphine and related, Latex, and Tape   Review of Systems Review of Systems  Constitutional: Positive for appetite change. Negative for chills and fever.  Respiratory: Negative for shortness of breath.   Cardiovascular: Negative for chest pain.  Gastrointestinal: Positive for abdominal pain, constipation, nausea and vomiting. Negative for abdominal distention, blood in stool and rectal pain.  Genitourinary: Negative for decreased urine volume, difficulty urinating, dysuria and flank pain.  Musculoskeletal: Negative for back pain.  Skin: Negative for color change and rash.  Neurological: Negative for dizziness, weakness and numbness.  Hematological: Negative for adenopathy.     Physical Exam Updated Vital Signs BP (!) 102/58   Pulse 78   Temp (!) 97.2 F (36.2 C) (Temporal)   Resp 16   Ht 5\' 2"  (1.575 m)   Wt 90.7 kg   LMP 06/22/2018   SpO2 98%   BMI 36.57 kg/m   Physical Exam Vitals signs and nursing note reviewed.  Constitutional:      Appearance: She is well-developed. She is not ill-appearing or toxic-appearing.  HENT:      Mouth/Throat:     Mouth: Mucous membranes are moist.  Cardiovascular:     Rate and Rhythm: Normal rate and regular rhythm.  Pulmonary:     Effort: Pulmonary effort is normal.     Breath sounds: Normal breath sounds.  Abdominal:     General: There is no distension.     Palpations: Abdomen is soft. There is no mass.     Tenderness: There is abdominal tenderness. There is no right CVA tenderness, left CVA tenderness or guarding.     Comments: Minimal ttp of the lower abdomen. abd is soft.  No guarding or rebound tenderness.   Musculoskeletal: Normal range of motion.  Skin:  General: Skin is warm.     Findings: No rash.  Neurological:     General: No focal deficit present.     Mental Status: She is alert.     Sensory: No sensory deficit.     Motor: No weakness.      ED Treatments / Results  Labs (all labs ordered are listed, but only abnormal results are displayed) Labs Reviewed  BASIC METABOLIC PANEL - Abnormal; Notable for the following components:      Result Value   Glucose, Bld 100 (*)    All other components within normal limits  URINALYSIS, ROUTINE W REFLEX MICROSCOPIC - Abnormal; Notable for the following components:   APPearance CLOUDY (*)    Ketones, ur 5 (*)    All other components within normal limits  CBC WITH DIFFERENTIAL/PLATELET  I-STAT BETA HCG BLOOD, ED ([facility], [facility], [facility] ONLY)    EKG    Radiology Dg Abdomen 1 View  Result Date: 07/13/2018 CLINICAL DATA:  Constipation, pain EXAM: ABDOMEN - 1 VIEW COMPARISON:  07/08/2018 FINDINGS: The bowel gas pattern is normal. No radio-opaque calculi or other significant radiographic abnormality are seen. IMPRESSION: Negative. Electronically Signed   By: Charlett NoseKevin  Dover M.D.   On: 07/13/2018 21:18    Procedures Procedures (including critical care time)  Medications Ordered in ED Medications  sodium chloride 0.9 % bolus 1,000 mL (0 mLs Intravenous Stopped 07/13/18 2035)  bisacodyl (DULCOLAX) suppository 10 mg (10 mg  Rectal Given 07/13/18 2201)  ondansetron (ZOFRAN-ODT) disintegrating tablet 8 mg (8 mg Oral Given 07/13/18 2201)     Initial Impression / Assessment and Plan / ED Course  I have reviewed the triage vital signs and the nursing notes.  Pertinent labs & imaging results that were available during my care of the patient were reviewed by me and considered in my medical decision making (see chart for details).        Pt with complaint of continued constipation.  Plain film of abdomen today does not show constipation as shown in previous visit.  Labs reassuring.  Pt is well appearing, resting comfortably,no vomiting during ER stay.  Vitals stable.  No concerning sx's for acute abdomen, or SBO.  Pt states that she had tele visit with GI, but not seen in office yet. She appears stable for d/c, Doculax supp given here, she will continue bentyl and Miralax.      Final Clinical Impressions(s) / ED Diagnoses   Final diagnoses:  Constipation, unspecified constipation type    ED Discharge Orders    None       Pauline Ausriplett, Kacey Vicuna, PA-C 07/15/18 1403    Vanetta MuldersZackowski, Scott, MD 07/17/18 [phone]

## 2018-07-17 ENCOUNTER — Other Ambulatory Visit: Payer: Self-pay | Admitting: Family Medicine

## 2018-07-18 ENCOUNTER — Ambulatory Visit (INDEPENDENT_AMBULATORY_CARE_PROVIDER_SITE_OTHER): Payer: Medicaid Other | Admitting: Family Medicine

## 2018-07-18 ENCOUNTER — Other Ambulatory Visit: Payer: Self-pay

## 2018-07-18 ENCOUNTER — Encounter: Payer: Self-pay | Admitting: Family Medicine

## 2018-07-18 DIAGNOSIS — K581 Irritable bowel syndrome with constipation: Secondary | ICD-10-CM | POA: Diagnosis not present

## 2018-07-18 MED ORDER — HYOSCYAMINE SULFATE 0.125 MG PO TABS: 0.1250 mg | ORAL_TABLET | ORAL | 2 refills | Status: DC | PRN

## 2018-07-18 MED ORDER — LUBIPROSTONE 24 MCG PO CAPS: 24.0000 ug | ORAL_CAPSULE | Freq: Two times a day (BID) | ORAL | 1 refills | Status: DC

## 2018-07-18 MED ORDER — ONDANSETRON 8 MG PO TBDP: 8.0000 mg | ORAL_TABLET | Freq: Three times a day (TID) | ORAL | 2 refills | Status: DC | PRN

## 2018-07-18 NOTE — Progress Notes (Signed)
Subjective:    Patient ID: Amber Marsh, female    DOB: Jun 17, 1995, 23 y.o.   MRN: [id number]   HPI: Amber Marsh is a 23 y.o. female presenting for had a blockage and couldn't poop. Given new medication to help with BMs. HAving no  BMs and med is causing a lot of gas. Pain now at RLQ. 7/10. Not eating. Barely drinking. Vomits a lot. Can suck on crackers.Has had appendectomy.  Went to E.D. 2 weeks ago. Given multiple meds over time. Thinks the new one causing increase of pain and gas is linzess. (Says [name] prescribed, but not recorded in chart.)   Depression screen [facility] 2/9 01/24/2018 06/21/2016  Decreased Interest 0 0  Down, Depressed, Hopeless 0 0  PHQ - 2 Score 0 0  Altered sleeping 3 -  Tired, decreased energy 0 -  Change in appetite 0 -  Feeling bad or failure about yourself  0 -  Trouble concentrating 0 -  Moving slowly or fidgety/restless 0 -  Suicidal thoughts 0 -  PHQ-9 Score 3 -  Difficult doing work/chores Not difficult at all -     Relevant past medical, surgical, family and social history reviewed and updated as indicated.  Interim medical history since our last visit reviewed. Allergies and medications reviewed and updated.  ROS:  Review of Systems  Constitutional: Negative.   HENT: Negative.   Eyes: Negative for visual disturbance.  Respiratory: Negative for shortness of breath.   Cardiovascular: Negative for chest pain.  Gastrointestinal: Positive for abdominal distention, abdominal pain, constipation, nausea and vomiting. Negative for blood in stool and diarrhea.  Musculoskeletal: Negative for arthralgias.     Social History   Tobacco Use  Smoking Status Current Every Day Smoker  . Packs/day: 0.50  . Years: 3.00  . Pack years: 1.50  . Types: Cigarettes  Smokeless Tobacco Never Used       Objective:     Wt Readings from Last 3 Encounters:  07/13/18 199 lb 15.3 oz (90.7 kg)  07/08/18 199 lb 15.3 oz (90.7 kg)  02/05/18 183 lb (83 kg)      Exam deferred. Pt. Harboring due to COVID 19. Phone visit performed.   Assessment & Plan:   1. Irritable bowel syndrome with constipation     Meds ordered this encounter  Medications  . ondansetron (ZOFRAN-ODT) 8 MG disintegrating tablet    Sig: Take 1 tablet (8 mg total) by mouth every 8 (eight) hours as needed for nausea or vomiting.    Dispense:  20 tablet    Refill:  2  . lubiprostone (AMITIZA) 24 MCG capsule    Sig: Take 1 capsule (24 mcg total) by mouth 2 (two) times daily with a meal.    Dispense:  60 capsule    Refill:  1  . hyoscyamine (LEVSIN) 0.125 MG tablet    Sig: Take 1 tablet (0.125 mg total) by mouth every 4 (four) hours as needed. For bloating and/or abd cramping    Dispense:  50 tablet    Refill:  2    No orders of the defined types were placed in this encounter.     Diagnoses and all orders for this visit:  Irritable bowel syndrome with constipation  Other orders -     ondansetron (ZOFRAN-ODT) 8 MG disintegrating tablet; Take 1 tablet (8 mg total) by mouth every 8 (eight) hours as needed for nausea or vomiting. -     lubiprostone (AMITIZA) 24 MCG capsule; Take  1 capsule (24 mcg total) by mouth 2 (two) times daily with a meal. -     hyoscyamine (LEVSIN) 0.125 MG tablet; Take 1 tablet (0.125 mg total) by mouth every 4 (four) hours as needed. For bloating and/or abd cramping    Virtual Visit via telephone Note  I discussed the limitations, risks, security and privacy concerns of performing an evaluation and management service by telephone and the availability of in person appointments. The patient was identified with two identifiers. Pt.expressed understanding and agreed to proceed. Pt. Is at home. Dr. Darlyn ReadStacks is in his office.  Follow Up Instructions:   I discussed the assessment and treatment plan with the patient. The patient was provided an opportunity to ask questions and all were answered. The patient agreed with the plan and demonstrated an  understanding of the instructions.   The patient was advised to call back or seek an in-person evaluation if the symptoms worsen or if the condition fails to improve as anticipated.   Total minutes including chart review and phone contact time: 20   Follow up plan: Return in about 2 weeks (around 08/01/2018).  Mechele ClaudeWarren Niko Jakel, MD [facility]

## 2018-08-03 ENCOUNTER — Encounter: Payer: Self-pay | Admitting: Family Medicine

## 2018-08-13 NOTE — Progress Notes (Addendum)
REVIEWED-NO ADDITIONAL RECOMMENDATIONS.  Referring Provider: Raliegh IpGottschalk, Ashly M, DO Primary Care Physician:  Raliegh IpGottschalk, Ashly M, DO Primary Gastroenterologist:  Dr. Darrick PennaFields  Chief Complaint  Patient presents with  . Rectal Bleeding    no episode in a few weeks  . Constipation    last BM was 1 month ago per pt, straining   . Emesis    only able to eat certain foods  . Abdominal Pain    RLQ- constant, at times will move throughout the stomach and into lower back    HPI:   Amber Marsh is a 23 y.o. female presenting today at the request of Ashly M  Gottschalk, DO for constipation and rectal bleeding.    Patient has been seen in the ED 4 times in June 2020 for constipation, lower abdominal pain, nausea, and vomiting. Labs in the ED without significant findings. CT abdomen and pelvis on 07/03/18 with fecalization of the terminal ileum consistent with slow transit or stasis within the distal small bowel. Stomach is unremarkable. No evidence of a small-bowel obstruction. Small fat containing and questionable small bowel containing periumbilical hernia. Patient was prescibed miralax. Returned to the ED on 6/23 after having bloody BM. Abdominal x ray with moderate stool in the colon. Patient to continue MiraLAX. Returned again on 6/23 with continued RLQ pain, constipation, but this time with N/V. Patient recommended to continued MiraLAX, also prescribed bentyl and phenergan. Last seen in the ED on 6/28 with persistent symptoms. DG abd with no significant findings. Recommended continuing current meds. Saw PCP on 07/18/18 with continued symptoms. Prescribed Amitiza 24 mcg, levsin, and zofran.   Today patient states her symptoms began with RLQ pain around the beginning of June. At this time she continues to have constant RLQ pain, but pain now will be across her entire lower abdomen and in her back at times. Will use heating pad to help with pain. Usually 5/10, sometimes up to a 10 and crying. No  improvement in constipation. She has tried miraLAX and mag citrate. She was passing some liquid stools initially, but states she has not had any BMs for the last 2 weeks. Sometimes feels like she has to go but can't. Has been taking medication for constipation daily that her PCP prescribed on 7/3. PCP prescribed Amitiza, but patient states she thinks she is taking Linzess and tells me the bottle says to take once daily on an empty stomach. Also taking Levsin every 4 hours. Reports she had one episode of passing red liquid rectally after returning home from the ED where they attempted 5 enemas and a suppository. Before the onset of these symptoms in June, patient had BMs every other day.   She also developed nausea and vomiting as her constipation and abdominal pain worsened. Maybe 3 weeks after onset of RLQ pain. Only able to keep watermelon and yogurt down. Crackers, she can let melt in her mouth. Tried broth and mashed potatoes, but threw them up. Takes nausea pills in the morning which help throughout the day. When she eats, it brings on the nausea. Red in vomit due to watermelon. No coffee ground emesis. Sometimes yellow, but not green or dark in color. Also with sharp epigastric pain on and off throughout the day. Started after nausea and vomiting. No heartburn or acid reflux. Takes ibuprofen as needed. Once a week. Takes 4 at a time. Admits to history of projectile vomiting a few years ago every time she ate, but has not had any for  a long time. [facility]. This she was around age 23. Doesn't remember findings.   Also reports new onset of lower abdominal pain while urinating for the last couple days. No burning, no smell, no cloudiness. No hematuria. Urine pale yellow.   No fever or chills. Admits to some lightheadedness with vomiting. No syncope. No chest pain, palpitations, SOB, or coughing. Patient admits to weight loss over the last month,  but not sure how much. When looking in our system, there is 18 lb weight loss since June 28th. Patient doesn't think she has lost that much.     Past Medical History:  Diagnosis Date  . Allergy    seasonal allergy  . Appendicitis 05/2016  . Asthma    exercise induced  . Constipation   . Migraines     Past Surgical History:  Procedure Laterality Date  . APPENDECTOMY  06/10/2016  . ELBOW SURGERY Right    in elementary school  . [facility]  . KNEE SURGERY Right 2018  . LAPAROSCOPIC APPENDECTOMY N/A 06/10/2016   Procedure: APPENDECTOMY LAPAROSCOPIC OPEN;  Surgeon: Harriette Bouillonornett, Thomas, MD;  Location: [facility] OR;  Service: General;  Laterality: N/A;    Current Outpatient Medications  Medication Sig Dispense Refill  . albuterol (PROVENTIL HFA;VENTOLIN HFA) 108 (90 Base) MCG/ACT inhaler Inhale 1-2 puffs into the lungs every 4 (four) hours as needed for wheezing or shortness of breath. 1 Inhaler 1  . hyoscyamine (LEVSIN) 0.125 MG tablet Take 1 tablet (0.125 mg total) by mouth every 4 (four) hours as needed. For bloating and/or abd cramping 50 tablet 2  . ibuprofen (ADVIL) 600 MG tablet Take 600 mg by mouth every 6 (six) hours as needed for headache or mild pain (or migraine-related pain).     Marland Kitchen. loratadine (CLARITIN) 10 MG tablet Take 1 tablet (10 mg total) by mouth daily. 90 tablet 4  . lubiprostone (AMITIZA) 24 MCG capsule Take 1 capsule (24 mcg total) by mouth 2 (two) times daily with a meal. 60 capsule 1  . norelgestromin-ethinyl estradiol (XULANE) 150-35 MCG/24HR transdermal patch APPLY 1 PATCH ONCE A WEEK (Patient taking differently: Place 1 patch onto the skin See admin instructions. Apply 1 patch transdermally once a week for 3 weeks, off on 4th week) 3 patch 12  . ondansetron (ZOFRAN-ODT) 8 MG disintegrating tablet Take 1 tablet (8 mg total) by mouth every 8 (eight) hours as needed for nausea or vomiting. 20 tablet 2  . SUMAtriptan  (IMITREX) 50 MG tablet TAKE 1 TABLET BY MOUTH EVERY 2 HRS AS NEEDED FOR MIGRAINE. MAY REPEAT 1X IN 2 HOURS IF HEADACHE PERSISTS OR RECURS. 10 tablet 1  . pantoprazole (PROTONIX) 40 MG tablet Take 1 tablet (40 mg total) by mouth daily before breakfast. Take 30 minutes before breakfast 90 tablet 3  . Prucalopride Succinate (MOTEGRITY) 2 MG TABS Take 2 mg by mouth daily. 30 tablet 3   No current facility-administered medications for this visit.     Allergies as of 08/14/2018 - Review Complete 08/14/2018  Allergen Reaction Noted  . Penicillins Other (See Comments) 05/08/2016  . Grapeseed extract [nutritional supplements] Nausea And Vomiting 05/08/2016  . Morphine and related Hypertension 05/08/2016  . Latex Rash 05/08/2016  . Tape Rash 07/03/2018    Family History  Problem Relation Age of Onset  . Migraines Mother   . Ovarian cysts Mother   . Hypertension Father   .  Hypertension Maternal Grandmother   . Migraines Maternal Grandmother   . Cervical cancer Maternal Grandmother   . Diabetes Paternal Grandmother   . Crohn's disease Neg Hx   . Colon cancer Neg Hx   . Ulcerative colitis Neg Hx     Social History   Socioeconomic History  . Marital status: Significant Other    Spouse name: Not on file  . Number of children: 0  . Years of education: Not on file  . Highest education level: Not on file  Occupational History  . Occupation: Educational psychologist    Comment: [place]  Social Needs  . Financial resource strain: Not on file  . Food insecurity    Worry: Not on file    Inability: Not on file  . Transportation needs    Medical: Not on file    Non-medical: Not on file  Tobacco Use  . Smoking status: Current Every Day Smoker    Packs/day: 0.50    Years: 3.00    Pack years: 1.50    Types: Cigarettes  . Smokeless tobacco: Never Used  Substance and Sexual Activity  . Alcohol use: Yes    Comment: occasional  . Drug use: No  . Sexual activity: Yes    Birth control/protection:  Patch  Lifestyle  . Physical activity    Days per week: Not on file    Minutes per session: Not on file  . Stress: Not on file  Relationships  . Social Herbalist on phone: Not on file    Gets together: Not on file    Attends religious service: Not on file    Active member of club or organization: Not on file    Attends meetings of clubs or organizations: Not on file    Relationship status: Not on file  . Intimate partner violence    Fear of current or ex partner: Not on file    Emotionally abused: Not on file    Physically abused: Not on file    Forced sexual activity: Not on file  Other Topics Concern  . Not on file  Social History Narrative  . Not on file    Review of Systems: Gen: See HPI  CV: See HPI Resp:See HPI GI:See HPI GU : See HPI Heme:See HPI  Physical Exam: BP 116/78   Pulse (!) 110   Temp 98.4 F (36.9 C) (Oral)   Ht 5\' 3"  (1.6 m)   Wt 181 lb (82.1 kg)   LMP 07/15/2018 Comment: on BC-patch  BMI 32.06 kg/m  General:   Alert and oriented. Pleasant and cooperative. Well-nourished and well-developed.  Head:  Normocephalic and atraumatic. Eyes:  Without icterus, sclera clear and conjunctiva pink.  Ears:  Normal auditory acuity. Nose:  No deformity, discharge,  or lesions. Lungs:  Clear to auscultation bilaterally. No wheezes, rales, or rhonchi. No distress.  Heart:  S1, S2 present without murmurs appreciated.  Abdomen:  +BS, soft, non-distended. Diffusely tender to palpation in entire abdomen. Greatest on RLQ. No rebound. Vertical scar at navel from appendectomy. No hernias or masses appreciated.  Back: No CVA tenderness.  Rectal:  Deferred  Msk:  Symmetrical without gross deformities. Normal posture. Pulses:  Normal pulses noted. Extremities:  Without clubbing or edema. Neurologic:  Alert and  oriented x4;  grossly normal neurologically. Skin:  Intact without significant lesions or rashes. Psych:  Alert and cooperative. Normal mood and  affect.

## 2018-08-14 ENCOUNTER — Encounter: Payer: Self-pay | Admitting: Gastroenterology

## 2018-08-14 ENCOUNTER — Other Ambulatory Visit: Payer: Self-pay

## 2018-08-14 ENCOUNTER — Ambulatory Visit (HOSPITAL_COMMUNITY)
Admission: RE | Admit: 2018-08-14 | Discharge: 2018-08-14 | Disposition: A | Discharge status: Home / Self Care | Payer: Medicaid Other | Source: Ambulatory Visit | Attending: Gastroenterology | Admitting: Gastroenterology

## 2018-08-14 ENCOUNTER — Ambulatory Visit (INDEPENDENT_AMBULATORY_CARE_PROVIDER_SITE_OTHER): Payer: Medicaid Other | Admitting: Gastroenterology

## 2018-08-14 ENCOUNTER — Other Ambulatory Visit (HOSPITAL_COMMUNITY)
Admission: RE | Admit: 2018-08-14 | Discharge: 2018-08-14 | Disposition: A | Discharge status: Home / Self Care | Payer: Medicaid Other | Source: Ambulatory Visit | Attending: Gastroenterology | Admitting: Gastroenterology

## 2018-08-14 VITALS — BP 116/78 | HR 110 | Temp 98.4°F | Ht 63.0 in | Wt 181.0 lb

## 2018-08-14 DIAGNOSIS — K5900 Constipation, unspecified: Secondary | ICD-10-CM

## 2018-08-14 DIAGNOSIS — R1084 Generalized abdominal pain: Secondary | ICD-10-CM

## 2018-08-14 DIAGNOSIS — R634 Abnormal weight loss: Secondary | ICD-10-CM

## 2018-08-14 DIAGNOSIS — R112 Nausea with vomiting, unspecified: Secondary | ICD-10-CM

## 2018-08-14 DIAGNOSIS — R1013 Epigastric pain: Secondary | ICD-10-CM | POA: Diagnosis not present

## 2018-08-14 DIAGNOSIS — R309 Painful micturition, unspecified: Secondary | ICD-10-CM

## 2018-08-14 DIAGNOSIS — R109 Unspecified abdominal pain: Secondary | ICD-10-CM | POA: Diagnosis not present

## 2018-08-14 LAB — CBC
HCT: 42.9 % (ref 36.0–46.0)
Hemoglobin: 14 g/dL (ref 12.0–15.0)
MCH: 28.7 pg (ref 26.0–34.0)
MCHC: 32.6 g/dL (ref 30.0–36.0)
MCV: 88.1 fL (ref 80.0–100.0)
Platelets: 257 10*3/uL (ref 150–400)
RBC: 4.87 MIL/uL (ref 3.87–5.11)
RDW: 13.2 % (ref 11.5–15.5)
WBC: 8.4 10*3/uL (ref 4.0–10.5)
nRBC: 0 % (ref 0.0–0.2)

## 2018-08-14 LAB — URINALYSIS, ROUTINE W REFLEX MICROSCOPIC
Bilirubin Urine: NEGATIVE
Glucose, UA: NEGATIVE mg/dL
Hgb urine dipstick: NEGATIVE
Ketones, ur: NEGATIVE mg/dL
Leukocytes,Ua: NEGATIVE
Nitrite: NEGATIVE
Protein, ur: NEGATIVE mg/dL
Specific Gravity, Urine: 1.02 (ref 1.005–1.030)
pH: 5 (ref 5.0–8.0)

## 2018-08-14 LAB — TSH: TSH: 1.434 u[IU]/mL (ref 0.350–4.500)

## 2018-08-14 LAB — BASIC METABOLIC PANEL
Anion gap: 8 (ref 5–15)
BUN: 7 mg/dL (ref 6–20)
CO2: 25 mmol/L (ref 22–32)
Calcium: 9.4 mg/dL (ref 8.9–10.3)
Chloride: 106 mmol/L (ref 98–111)
Creatinine, Ser: 0.54 mg/dL (ref 0.44–1.00)
GFR calc Af Amer: >60 mL/min (ref >60)
GFR calc non Af Amer: >60 mL/min (ref >60)
Glucose, Bld: 94 mg/dL (ref 70–99)
Potassium: 4.1 mmol/L (ref 3.5–5.1)
Sodium: 139 mmol/L (ref 135–145)

## 2018-08-14 MED ORDER — MOTEGRITY 2 MG PO TABS: 2.0000 mg | ORAL_TABLET | Freq: Every day | ORAL | 3 refills | Status: DC

## 2018-08-14 MED ORDER — PANTOPRAZOLE SODIUM 40 MG PO TBEC: 40.0000 mg | DELAYED_RELEASE_TABLET | Freq: Every day | ORAL | 3 refills | Status: AC

## 2018-08-14 NOTE — Patient Instructions (Addendum)
Please have abdominal x-ray completed at [facility].   I have also ordered labs. You can have those completed at [facility] as well.   I have sent in [place] 2 mg daily to your pharmacy. Wait to start taking this until we get the results of your x-ray. I want to be sure there is no sign of obstruction. Once you start taking Motegrity, you should stop Linzess and or Amitiza, which ever one you are taking.   Please stop taking Levsin.   I have called in Protonix 40 mg to your pharmacy. Please take this in the morning 30 minutes before your first meal.   Please stop ibuprofen. Use tylenol as needed.   We will see you back in 4 weeks. Please call in 1 week to let us know how you are doing.   Aliene Altes, PA-C [facility]

## 2018-08-16 ENCOUNTER — Telehealth: Payer: Self-pay | Admitting: Internal Medicine

## 2018-08-16 ENCOUNTER — Encounter: Payer: Self-pay | Admitting: Gastroenterology

## 2018-08-16 DIAGNOSIS — R109 Unspecified abdominal pain: Secondary | ICD-10-CM | POA: Insufficient documentation

## 2018-08-16 DIAGNOSIS — K5900 Constipation, unspecified: Secondary | ICD-10-CM | POA: Insufficient documentation

## 2018-08-16 DIAGNOSIS — R112 Nausea with vomiting, unspecified: Secondary | ICD-10-CM | POA: Insufficient documentation

## 2018-08-16 DIAGNOSIS — R309 Painful micturition, unspecified: Secondary | ICD-10-CM | POA: Insufficient documentation

## 2018-08-16 DIAGNOSIS — R634 Abnormal weight loss: Secondary | ICD-10-CM | POA: Insufficient documentation

## 2018-08-16 LAB — URINE CULTURE: Culture: 100000 — AB

## 2018-08-16 NOTE — Assessment & Plan Note (Addendum)
Patient with nausea and vomiting that developed as her abdominal pain and constipation symptoms worsened. For the last few weeks, patient has only been able to tolerate watermelon and yogurt. Some red in the vomit due to watermelon, no coffee ground emesis, or bilious vomit. Documented 18 lb weight loss over the last month, but not sure if this is accurate. Today's weight is comparable to patients weight in Jan 2020. Also with daily intermittent sharp epigastric pain which developed after nausea/vomiting. No heartburn or reflux symptoms. No dysphagia. 4 ibuprofen at a time, about once a week. Remote history of projectile vomiting a few years ago. EGD at that time. Unsure of findings. Stomach was unremarkable on CT abdomen and pelvis on 07/03/18. Lipase and pregnancy test negative on 6/24.   Suspect nausea and vomiting are related to severe constipation. Intermittent epigastric pain could represent gastritis or PUD, but may also be related to constipation and associated abdominal pain.  Continue Zofran. May take every 8 hours.  Continue drinking plenty of fluids.  Eat as tolerated.  BRAT diet.  Start Protonix 40 mg daily 30 minutes before breakfast.  Avoid ibuprofen. Try tylenol.  Patient may end up needing EGD at time of colonoscopy if nausea/vomiting and epigastric pain do not significantly improve.  Request EGD report from [facility]

## 2018-08-16 NOTE — Assessment & Plan Note (Addendum)
Addressed under abdominal pain.  

## 2018-08-16 NOTE — Telephone Encounter (Signed)
This morning the microbiologist called regarding this patient's culture and sensitivity report-urine.  The technician had trouble putting in the correct results.  Felt it would be confusing;  she called me to tell me that for sure patient had greater than 100 CFU of Enterobacter aerugenes in her urine -lab felt no mixup in urine specimens.. Interestingly, apparently culture and UA done at the same time -  urinalysis came back pretty benign looking.  Sensitivities reveal organism nicely sensitive to Cipro.  I called patient.  States Motegrity caused shortness of breath-first dose this morning.  Doing better now.  Felt she is passing some blood with urination.  No flank pain or fever.  Denies pregnancy on BCP.  I told her I recommend treating her UTI with Cipro and stop Motegrity for now.  Calling in Cipro 500 mg once daily x3 days to [place].  We need to check on this lady next week.

## 2018-08-16 NOTE — Assessment & Plan Note (Addendum)
Patient reports severe pain in her lower abdomen while urinating that has been present for the last couple of days. Denies hematuria, burning with urination, change in smell, or cloudiness of urine. No CVA tenderness on exam.   Will get urinalysis and urine culture to rule out infection.

## 2018-08-16 NOTE — Assessment & Plan Note (Addendum)
23 y.o. female with onset of RLQ pain in early June that has progressed to constipation/obstipation, constant RLQ abdominal pain with diffuse lower abdominal and lower back pain at times, as well as nausea and vomiting. Patient was passing liquid stools, but no BM for the last two weeks. MiraLAX and mag citrate tried. Patient is taking Linzess daily since 7/3. Also taking Levsin every 4 hours. Had also taken bentyl prescribed by ED. One episode of rectal bleeding at home after ED staff had tried multiple enemas. Documented 18 lb weight loss over the last month, but not sure if this is accurate. Today's weight is comparable to patients weight in Jan 2020. Abdomen is soft on exam, but diffusely tender to palpation. History of appendectomy in 2018. No family history of colon cancer, or IBD. CT abdomen and pelvis on 07/03/18 with fecalization of the terminal ileum consistent with slow transit or stasis within the distal small bowel. No small-bowel obstruction. Small fat containing and questionable small bowel containing periumbilical hernia. DG abdomen and chest on 07/08/18 with moderate stool in the colon, non-obstructive bowel gas pattern. DG abdomen on 07/13/18 again with normal bowel gas pattern. No significant abnormality. On 07/09/18 Lipase 22, beta hCG negative.  07/13/18 WBC 9.7, Hgb 13.4. Electrolytes and kidney function within normal limits.   DDX: SBO, slow transit constipation complicated by Levsin/bentyl daily, IBS-C, thyroid disorder, polyps, less likely malignancy. Rectal bleeding may have been secondary to trauma with multiple enema attempts.   DG Abdomen 2 view today to rule out SBO.  CBC, BMP, TSH Patient needs a colonoscopy to evaluate for underlying etiology of significant change in bowel habits, rectal bleeding, and weight loss, but need to see if we can get her bowels moving to have a successful prep. Start Motegrity 2 mg daily pending DG abdomen today.  Patient to call in 1 week to let us know  how she is doing. .  Will see her back in 4 weeks.

## 2018-08-16 NOTE — Assessment & Plan Note (Signed)
Addressed under constipation and nausea with vomiting.

## 2018-08-18 NOTE — Telephone Encounter (Signed)
Noted. Can wait for [facility] tomorrow when we get progress report back. She should be on Cipro for UTI. Continue to avoid Motegrity.

## 2018-08-18 NOTE — Telephone Encounter (Signed)
Forwarding to Roseanne Kaufman, who is doing hospital today and Cyril Mourning and Dr. Oneida Alar are out today.  Fyi to me to check on her this week.

## 2018-08-19 NOTE — Telephone Encounter (Signed)
Patient needs to call her PCP regarding the UTI and burning and itching she is having.   Regarding constipation. Can we check to see what bowel prep samples we have? She does not have an obstruction, so I would like for her to complete a bowel prep. She also needs to use dulcolax suppository to try to get things moving. She should use 1 tomorrow and 1 on Thursday.   Also, is she taking Linzess? I would like to clarify what she is taking and the dosage because in office she didn't know if it was Linzess or Amitiza.

## 2018-08-19 NOTE — Telephone Encounter (Signed)
LMOM to call with progress report.

## 2018-08-19 NOTE — Progress Notes (Signed)
CC'ED TO PCP 

## 2018-08-19 NOTE — Telephone Encounter (Addendum)
I called pt and she said she has not taken any more Motegrity and will not.  Today is the last day of the Cipro and she is still having urgency of urination, but when she goes she does not do much.  She has burning and itching very bad.  Still having abdominal pain and can only keep foods like yogurt down.  She has not had a good BM for about a month, just watery BM.  Said she took a whole bottle of Miralax one day but she did not have any results.   Forwarding to [place] to advise!

## 2018-08-20 ENCOUNTER — Encounter: Payer: Self-pay | Admitting: Family Medicine

## 2018-08-20 ENCOUNTER — Ambulatory Visit (INDEPENDENT_AMBULATORY_CARE_PROVIDER_SITE_OTHER): Payer: Medicaid Other | Admitting: Family Medicine

## 2018-08-20 ENCOUNTER — Other Ambulatory Visit: Payer: Self-pay | Admitting: Gastroenterology

## 2018-08-20 ENCOUNTER — Telehealth: Payer: Self-pay | Admitting: Gastroenterology

## 2018-08-20 DIAGNOSIS — N3001 Acute cystitis with hematuria: Secondary | ICD-10-CM | POA: Diagnosis not present

## 2018-08-20 MED ORDER — PEG 3350-KCL-NA BICARB-NACL 420 G PO SOLR: 4000.0000 mL | Freq: Once | ORAL | 0 refills | Status: AC

## 2018-08-20 MED ORDER — CEPHALEXIN 500 MG PO CAPS: 500.0000 mg | ORAL_CAPSULE | Freq: Two times a day (BID) | ORAL | 0 refills | Status: DC

## 2018-08-20 MED ORDER — CEFDINIR 300 MG PO CAPS: 300.0000 mg | ORAL_CAPSULE | Freq: Two times a day (BID) | ORAL | 0 refills | Status: DC

## 2018-08-20 MED ORDER — FLUCONAZOLE 150 MG PO TABS: 150.0000 mg | ORAL_TABLET | Freq: Once | ORAL | 0 refills | Status: AC

## 2018-08-20 MED ORDER — PHENAZOPYRIDINE HCL 200 MG PO TABS: 200.0000 mg | ORAL_TABLET | Freq: Three times a day (TID) | ORAL | 0 refills | Status: DC | PRN

## 2018-08-20 NOTE — Progress Notes (Signed)
Opened in error

## 2018-08-20 NOTE — Telephone Encounter (Signed)
I will call pt Friday.  Also, I faxed the instructions to the pharmacy and called and confirmed they were received by the pharmacist, Jenny Reichmann.  They will give to pt when she picks up the prep.

## 2018-08-20 NOTE — Addendum Note (Signed)
Addended by: Inge Rise on: 08/20/2018 11:19 AM   Modules accepted: Orders

## 2018-08-20 NOTE — Telephone Encounter (Signed)
LMOM for a return call.  

## 2018-08-20 NOTE — Telephone Encounter (Signed)
See previous note

## 2018-08-20 NOTE — Telephone Encounter (Signed)
Medicaid covers clenpiq

## 2018-08-20 NOTE — Telephone Encounter (Signed)
I would like to use Trilyte for her. She can start the prep in the morning.   Instructions for Trilyte: 8am: Start drinking your solution. Make sure you mix well per instructions on the bottle. Try to drink 1 (one) 8 ounce glass every 10-15 minutes until you have consumed HALF the jug. You should complete around 10 am. You must keep the left over solution refrigerated.   If bowels are starting to move well, you can save the rest of the prep for the next morning and complete the same way as the first half. If bowel have not started moving well, you can take a break for a few hours and start drinking the rest of the prep around 6 pm.   Be sure to drink plenty of fluids throughout this prep to prevent dehyration and kidney failure.    HELPFUL HINTS FOR DRINKING PREP SOLUTION:   Make sure prep is extremely cold. Mix and refrigerate the the morning of the prep. You may also put in the freezer.   You may try mixing some Crystal Light or Country Time Lemonade if you prefer. Mix in small amounts; add more if necessary.  Try drinking through a straw  Rinse mouth with water or a mouthwash between glasses, to remove after-taste.  Try sipping on a cold beverage /ice/ popsicles between glasses of prep.  Place a piece of sugar-free hard candy in mouth between glasses.  If you become nauseated, try consuming smaller amounts, or stretch out the time between glasses. Stop for 30-60 minutes, then slowly start back drinking.    She should also go ahead and use the dulcolax suppositories. One today and one tomorrow. Is starting tomorrow, one tomorrow and one Friday.   [facility], can you send in [place]?

## 2018-08-20 NOTE — Patient Instructions (Signed)
Urinary Tract Infection, Adult A urinary tract infection (UTI) is an infection of any part of the urinary tract. The urinary tract includes:  The kidneys.  The ureters.  The bladder.  The urethra. These organs make, store, and get rid of pee (urine) in the body. What are the causes? This is caused by germs (bacteria) in your genital area. These germs grow and cause swelling (inflammation) of your urinary tract. What increases the risk? You are more likely to develop this condition if:  You have a small, thin tube (catheter) to drain pee.  You cannot control when you pee or poop (incontinence).  You are female, and: ? You use these methods to prevent pregnancy: ? A medicine that kills sperm (spermicide). ? A device that blocks sperm (diaphragm). ? You have low levels of a female hormone (estrogen). ? You are pregnant.  You have genes that add to your risk.  You are sexually active.  You take antibiotic medicines.  You have trouble peeing because of: ? A prostate that is bigger than normal, if you are female. ? A blockage in the part of your body that drains pee from the bladder (urethra). ? A kidney stone. ? A nerve condition that affects your bladder (neurogenic bladder). ? Not getting enough to drink. ? Not peeing often enough.  You have other conditions, such as: ? Diabetes. ? A weak disease-fighting system (immune system). ? Sickle cell disease. ? Gout. ? Injury of the spine. What are the signs or symptoms? Symptoms of this condition include:  Needing to pee right away (urgently).  Peeing often.  Peeing small amounts often.  Pain or burning when peeing.  Blood in the pee.  Pee that smells bad or not like normal.  Trouble peeing.  Pee that is cloudy.  Fluid coming from the vagina, if you are female.  Pain in the belly or lower back. Other symptoms include:  Throwing up (vomiting).  No urge to eat.  Feeling mixed up (confused).  Being tired  and grouchy (irritable).  A fever.  Watery poop (diarrhea). How is this treated? This condition may be treated with:  Antibiotic medicine.  Other medicines.  Drinking enough water. Follow these instructions at home:  Medicines  Take over-the-counter and prescription medicines only as told by your doctor.  If you were prescribed an antibiotic medicine, take it as told by your doctor. Do not stop taking it even if you start to feel better. General instructions  Make sure you: ? Pee until your bladder is empty. ? Do not hold pee for a long time. ? Empty your bladder after sex. ? Wipe from front to back after pooping if you are a female. Use each tissue one time when you wipe.  Drink enough fluid to keep your pee pale yellow.  Keep all follow-up visits as told by your doctor. This is important. Contact a doctor if:  You do not get better after 1-2 days.  Your symptoms go away and then come back. Get help right away if:  You have very bad back pain.  You have very bad pain in your lower belly.  You have a fever.  You are sick to your stomach (nauseous).  You are throwing up. Summary  A urinary tract infection (UTI) is an infection of any part of the urinary tract.  This condition is caused by germs in your genital area.  There are many risk factors for a UTI. These include having a small, thin   tube to drain pee and not being able to control when you pee or poop.  Treatment includes antibiotic medicines for germs.  Drink enough fluid to keep your pee pale yellow. This information is not intended to replace advice given to you by your health care provider. Make sure you discuss any questions you have with your health care provider. Document Released: 06/20/2007 Document Revised: 12/19/2017 Document Reviewed: 07/11/2017 Elsevier Patient Education  2020 Elsevier Inc.  

## 2018-08-20 NOTE — Progress Notes (Signed)
Telephone visit  Subjective: CC:UTI PCP: Raliegh IpGottschalk, Yale Golla M, DO WUJ:[id number]:Amber Marsh is a 23 y.o. female calls for telephone consult today. Patient provides verbal consent for consult held via phone.  Location of patient: home Location of provider: Working remotely from home Others present for call: none  1. UTI Patient was diagnosed and treated for urinary tract infection about a week ago.  She notes just finished up a 3-day course of antibiotics yesterday but she has ongoing symptoms.  She reports dysuria, hematuria after completing a stream and vaginal itching.  No fevers but she does have some nausea.   ROS: Per HPI  Allergies  Allergen Reactions  . Penicillins Other (See Comments)    From childhood: Did it involve swelling of the face/tongue/throat, SOB, or low BP? Unk Did it involve sudden or severe rash/hives, skin peeling, or any reaction on the inside of your mouth or nose? Unk Did you need to seek medical attention at a hospital or doctor's office? Yes When did it last happen? Childhood If all above answers are "NO", may proceed with cephalosporin use.    Clarise Cruz. Grapeseed Extract [Nutritional Supplements] Nausea And Vomiting  . Morphine And Related Hypertension  . Latex Rash  . Tape Rash    Has needed Benadryl afterwards   Past Medical History:  Diagnosis Date  . Allergy    seasonal allergy  . Appendicitis 05/2016  . Asthma    exercise induced  . Constipation   . Migraines     Current Outpatient Medications:  .  albuterol (PROVENTIL HFA;VENTOLIN HFA) 108 (90 Base) MCG/ACT inhaler, Inhale 1-2 puffs into the lungs every 4 (four) hours as needed for wheezing or shortness of breath., Disp: 1 Inhaler, Rfl: 1 .  hyoscyamine (LEVSIN) 0.125 MG tablet, Take 1 tablet (0.125 mg total) by mouth every 4 (four) hours as needed. For bloating and/or abd cramping, Disp: 50 tablet, Rfl: 2 .  ibuprofen (ADVIL) 600 MG tablet, Take 600 mg by mouth every 6 (six) hours as needed  for headache or mild pain (or migraine-related pain). , Disp: , Rfl:  .  loratadine (CLARITIN) 10 MG tablet, Take 1 tablet (10 mg total) by mouth daily., Disp: 90 tablet, Rfl: 4 .  lubiprostone (AMITIZA) 24 MCG capsule, Take 1 capsule (24 mcg total) by mouth 2 (two) times daily with a meal., Disp: 60 capsule, Rfl: 1 .  norelgestromin-ethinyl estradiol (XULANE) 150-35 MCG/24HR transdermal patch, APPLY 1 PATCH ONCE A WEEK (Patient taking differently: Place 1 patch onto the skin See admin instructions. Apply 1 patch transdermally once a week for 3 weeks, off on 4th week), Disp: 3 patch, Rfl: 12 .  ondansetron (ZOFRAN-ODT) 8 MG disintegrating tablet, Take 1 tablet (8 mg total) by mouth every 8 (eight) hours as needed for nausea or vomiting., Disp: 20 tablet, Rfl: 2 .  pantoprazole (PROTONIX) 40 MG tablet, Take 1 tablet (40 mg total) by mouth daily before breakfast. Take 30 minutes before breakfast, Disp: 90 tablet, Rfl: 3 .  polyethylene glycol-electrolytes (NULYTELY/GOLYTELY) 420 g solution, Take 4,000 mLs by mouth once for 1 dose., Disp: 4000 mL, Rfl: 0 .  Prucalopride Succinate (MOTEGRITY) 2 MG TABS, Take 2 mg by mouth daily., Disp: 30 tablet, Rfl: 3 .  SUMAtriptan (IMITREX) 50 MG tablet, TAKE 1 TABLET BY MOUTH EVERY 2 HRS AS NEEDED FOR MIGRAINE. MAY REPEAT 1X IN 2 HOURS IF HEADACHE PERSISTS OR RECURS., Disp: 10 tablet, Rfl: 1  Assessment/ Plan: 23 y.o. female   1. Acute cystitis with  hematuria I reviewed her urine culture which showed multidrug-resistant Enterobacter aerogenes.  Initially I had sent in Keflex but it appears that her urine is resistant to this so I sent in a third-generation cephalosporin, Omnicef instead.  I have prescribed her Pyridium and advised her not to use it more than the next 2 days in case she has ongoing symptoms.  Diflucan sent in for vaginal itching, presumed yeast vaginitis.  Home care instructions reviewed and reasons for emergent evaluation discussed.  She will  follow-up PRN. - phenazopyridine (PYRIDIUM) 200 MG tablet; Take 1 tablet (200 mg total) by mouth 3 (three) times daily as needed for pain (urinary).  Dispense: 6 tablet; Refill: 0 - fluconazole (DIFLUCAN) 150 MG tablet; Take 1 tablet (150 mg total) by mouth once for 1 dose.  Dispense: 1 tablet; Refill: 0 - cefdinir (OMNICEF) 300 MG capsule; Take 1 capsule (300 mg total) by mouth 2 (two) times daily for 7 days. 1 po BID  Dispense: 14 capsule; Refill: 0   Start time: 1:11pm End time: 1:17pm  Total time spent on patient care (including telephone call/ virtual visit): 12 minutes  [place], P[place]ton [phone]

## 2018-08-20 NOTE — Telephone Encounter (Signed)
Pt was returning a call from DS. [phone]

## 2018-08-20 NOTE — Telephone Encounter (Signed)
Rx sent in

## 2018-08-20 NOTE — Telephone Encounter (Addendum)
PT is aware of Amber Marsh's recommendations.  She said she was taking Linzess 290 mcg prior to starting on the [place] and having to stop it.  She has only a few capsules left of the Linzess 290 mcg.  She is aware to see PCP for the UTI , burning and itching.  She is aware to use a dulcolax suppository today and tomorrow.  She is aware to use the Trilyte prep and I read all of the instructions to her.  She expressed understanding and said she will start it tomorrow.

## 2018-08-20 NOTE — Telephone Encounter (Signed)
Amber Marsh, can you request patients EGD records from [facility] in [facility]?

## 2018-08-20 NOTE — Telephone Encounter (Signed)
[facility] Clinical Pool: See prior note. Since we do not have bowel prep samples, can you check to see which bowel prep would be covered by patients insurance?

## 2018-08-20 NOTE — Telephone Encounter (Signed)
Ok, can we call Friday to see how she is doing?

## 2018-08-21 NOTE — Telephone Encounter (Signed)
Noted  

## 2018-08-21 NOTE — Telephone Encounter (Signed)
I will need the patient to sign a release of records before I can get them. I will mail patient a release in the morning.

## 2018-08-22 ENCOUNTER — Other Ambulatory Visit: Payer: Self-pay | Admitting: Gastroenterology

## 2018-08-22 DIAGNOSIS — R112 Nausea with vomiting, unspecified: Secondary | ICD-10-CM

## 2018-08-22 DIAGNOSIS — K5900 Constipation, unspecified: Secondary | ICD-10-CM

## 2018-08-22 MED ORDER — ONDANSETRON 8 MG PO TBDP: 8.0000 mg | ORAL_TABLET | Freq: Three times a day (TID) | ORAL | 2 refills | Status: DC | PRN

## 2018-08-22 MED ORDER — TRULANCE 3 MG PO TABS: 3.0000 mg | ORAL_TABLET | Freq: Every day | ORAL | 1 refills | Status: DC

## 2018-08-22 NOTE — Telephone Encounter (Signed)
I called pt and she said she is still having abdominal pain, the entire abdomen and gas.   She was unable to complete the [place], but did do about one half of it with no results.  Her PCP is treating her for the UTI and vaginal itching : Omnicef, Pyridium and Diflucan.  She is eating mostly yogurt and some broth and able to drink liquids.   Please advise!

## 2018-08-22 NOTE — Telephone Encounter (Signed)
Pt states she has had NO BM, passed just a little gas.  She tired the prep again this morning, even mixed country time lemonade with it, and it makes her so nauseated she just cannot tolerate it.  She is taking the Linzess 290 mcg daily 30 min before her yogurt or whatever little that she eats.

## 2018-08-22 NOTE — Telephone Encounter (Signed)
Has patient had a BM at all? Is she passing gas? Did she try drinking the second half of Trilyte the second day? Is she still taking Linzess 290 mcg?

## 2018-08-22 NOTE — Telephone Encounter (Signed)
Spoke with patient. Continues to have abdominal pain. No BM. She is passing gas. Only keeping ice pops down at this time. Tried chicken broth and yogurt, but has thrown those up. I am calling in Trulance. Patient is to take 3 mg daily. She is also to take 2 Bisacodyl tablets daily, and try a fleets enema. Advised her to go to the ED if her symptoms worsen over the weekend or if she is unable to keep anything down. She stated her understanding. Also refilling Zofran.

## 2018-08-25 NOTE — Telephone Encounter (Signed)
Noted  

## 2018-08-25 NOTE — Telephone Encounter (Signed)
Vicente Males has agreed to see patient tomorrow at 1:30 pm if she can come in to the office. She should go ahead and complete the enema tonight as she had planned. We really need to see her in office.

## 2018-08-25 NOTE — Telephone Encounter (Signed)
PT said she still has not had a BM, although she has passed a lot of gas.  She got the Trulance and is taking that.  She used 1/2 enema last night and is going to use the other one half tonight.  Said she got several enemas to have on hand to use.  She is still unable to eat anything but ice pops. The chicken broth and yogurt make her nauseated.  Her stomach is still hurting really bad but she said she is hoping to have BM soon.   Cyril Mourning, please advise!

## 2018-08-25 NOTE — Telephone Encounter (Signed)
Pt could not come tomorrow due to a funeral in her family.  She has been scheduled for 08/27/2018 at 3:00 pm with Neil Crouch, PA.

## 2018-08-25 NOTE — Telephone Encounter (Signed)
Doris, could we call patient today to see if she has had a BM yet?

## 2018-08-26 NOTE — Telephone Encounter (Signed)
REVIEWED-NO ADDITIONAL RECOMMENDATIONS. 

## 2018-08-26 NOTE — Telephone Encounter (Signed)
PT SHOULD HAVE A SMART PILL EVALUATION ON MEDS(LINZESS, TRULANCE, OR AMITIZA.), [id number]. PT SHOULD STOP MEDS THAT CAUSE CONSTIPATION: ZOFRAN AND HYOSCYAMINE.

## 2018-08-27 ENCOUNTER — Ambulatory Visit (HOSPITAL_COMMUNITY)
Admission: RE | Admit: 2018-08-27 | Discharge: 2018-08-27 | Disposition: A | Discharge status: Home / Self Care | Payer: Medicaid Other | Source: Ambulatory Visit | Attending: Gastroenterology | Admitting: Gastroenterology

## 2018-08-27 ENCOUNTER — Encounter: Payer: Self-pay | Admitting: Gastroenterology

## 2018-08-27 ENCOUNTER — Ambulatory Visit (INDEPENDENT_AMBULATORY_CARE_PROVIDER_SITE_OTHER): Payer: Medicaid Other | Admitting: Gastroenterology

## 2018-08-27 ENCOUNTER — Other Ambulatory Visit: Payer: Self-pay

## 2018-08-27 VITALS — BP 112/73 | HR 108 | Temp 97.3°F | Ht 62.0 in | Wt 180.2 lb

## 2018-08-27 DIAGNOSIS — R112 Nausea with vomiting, unspecified: Secondary | ICD-10-CM | POA: Insufficient documentation

## 2018-08-27 DIAGNOSIS — K5900 Constipation, unspecified: Secondary | ICD-10-CM

## 2018-08-27 DIAGNOSIS — R1030 Lower abdominal pain, unspecified: Secondary | ICD-10-CM

## 2018-08-27 MED ORDER — PROMETHAZINE HCL 12.5 MG PO TABS: 12.5000 mg | ORAL_TABLET | Freq: Four times a day (QID) | ORAL | 0 refills | Status: DC | PRN

## 2018-08-27 MED ORDER — LINACLOTIDE 290 MCG PO CAPS: 290.0000 ug | ORAL_CAPSULE | Freq: Every day | ORAL | 5 refills | Status: DC

## 2018-08-27 MED ORDER — POLYETHYLENE GLYCOL 3350 17 GM/SCOOP PO POWD: ORAL | 5 refills | Status: DC

## 2018-08-27 NOTE — Patient Instructions (Addendum)
1. Stop Trulance.  2. Restart Linzess 235mcg daily.  3. Take Miralax 17 grams twice per day. 4. Stop Levsin and Zofran because these can add to your constipation issues.  5. Phenergan can be used for significant nausea.  6. Go for abdominal xray to check on amount of stool present.  7. Smart Pill to evaluate your GI motility. You cannot take acid reflux medication of any kind (including pantoprazole) for 7 days prior to this study.

## 2018-08-27 NOTE — Progress Notes (Addendum)
REVIEWED.PT HAS A DOCUMENTED 1 LB WEIGHT LOSS SINCE JUL 2020 AND REPORTS UNABLE TO KEEP DOWN POs.  SHE HAS DECREASED PO INTAKE AND NO STOOL BURDEN ON SEP 1 CT ABD/PELVIS. STOP LEVSIN. SCHEDULE EGD WITH POSSIBLE PYLORIC DIILATON W/ MODERATE SEDATION, PHENERGAN 12.5 MG IV IN PREOP.  Primary Care Physician: Raliegh IpGottschalk, Ashly M, DO  Primary Gastroenterologist:  Jonette EvaSandi Fields, MD   Chief Complaint  Patient presents with  . Constipation    HPI: Amber Marsh is a 23 y.o. female here for follow-up of constipation, vomiting, abdominal pain.  Patient was seen in August 14, 2018 for constipation,rectal bleeding, abdominal pain, vomiting.    Initial [place] visit June 18. CT abdomen pelvis July 03, 2018 with equalization of the terminal ileum consistent with slow transit or stasis within the distal small bowel.  No evidence of small bowel obstruction.  Small fat-containing and questionable small bowel containing periumbilical hernia.  Initially started on MiraLAX.  [facility] he reportedly was given an enema and developed bloody diarrhea after that.  She was seen again on July 08, 2018 at [facility] ED for bloody stool.  DRE showed no stool or blood in the rectal vault.  Mild right lower quadrant tenderness.  Hemoglobin stable.  Abdominal x-ray with nonobstructive bowel gas pattern, moderate amount of stool in the colon.  Recommended follow-up with GI.  Continue MiraLAX.  Return for fourth ED visit (this time at [facility]) on July 13, 2018 again for right lower quadrant pain, constipation, nausea and vomiting at that point.  Abdominal x-ray negative.  Had been taking MiraLAX and a bottle of magnesium citrate, some watery stools but still felt constipated.  During ED visit she was given Dulcolax suppository, continue Bentyl and MiraLAX.    Since her initial office visit with us, she had abdominal x-ray July 30, moderate stool without obstruction or rectal impaction.  Her  TSH, calcium have been unremarkable.  No anemia.  Lipase and LFTs have been normal multiple times.  Previously tried on Amitiza and Linzess by PCP but she did not feel like she had good response.  She was started on pantoprazole at her last visit.  She was given MiraLAX purge with 1 dose every hour for 5 doses.  Started on [employer].  1 dose of Motegrity caused increased vomiting, chest tightness, shortness of breath and she was advised to stop.  Patient reported taking a whole bottle of MiraLAX without 1 bowel movement.  She was then given TriLyte, she took That showed but no result.  Unable to tolerate the rest due to vomiting.  Linzess was stopped, Trulance 3mg  daily started. Enema recommended. She did 1/2 and passed just liquid. No BM since.   Patient reports right lower quadrant pain began around the beginning of June.  Pain is constant but moves across the lower abdomen and into her back.  Sometimes severe.  Feels constipated.  No significant stool in weeks.  Often feels like she has to have a bowel movement and then does not pass anything.  She has been on Levsin as well for abdominal pain.  Was given Zofran for nausea.  Unable to keep anything down.  Tolerating some liquids, occasionally yogurt but really can only do sips.  Something like crackers or chips that melted in her mouth she may be able to tolerate.  Threw up broth and mashed potatoes.  Also with epigastric pain.  No heartburn or indigestion.  Takes  ibuprofen about 4/week.  Reports E[place]  at [facility] when she was 18.  Does not know the findings.  Reports 18 to 20 pound weight loss.  Recently urinalysis with culture performed and showed UTI which has been treated.  Never had constipation before two months ago. No melena. Had some brbpr before but none in few weeks. Only tolerates popsicles and water.  Vomits with dry cereal.  Last vomiting this morning.  Admits today that she has had some  stooling with Amitiza and Linzess previously, mostly liquid stuff.  No significant bowel movement.  No bowel movement since on Trulance. Taking Levsin and zofran twice per day.  Wt Readings from Last 3 Encounters:  08/27/18 180 lb 3.2 oz (81.7 kg)  08/14/18 181 lb (82.1 kg)  07/13/18 199 lb 15.3 oz (90.7 kg)     Current Outpatient Medications  Medication Sig Dispense Refill  . albuterol (PROVENTIL HFA;VENTOLIN HFA) 108 (90 Base) MCG/ACT inhaler Inhale 1-2 puffs into the lungs every 4 (four) hours as needed for wheezing or shortness of breath. 1 Inhaler 1  . hyoscyamine (LEVSIN) 0.125 MG tablet Take 1 tablet (0.125 mg total) by mouth every 4 (four) hours as needed. For bloating and/or abd cramping 50 tablet 2  . ibuprofen (ADVIL) 600 MG tablet Take 600 mg by mouth every 6 (six) hours as needed for headache or mild pain (or migraine-related pain).     Marland Kitchen. loratadine (CLARITIN) 10 MG tablet Take 1 tablet (10 mg total) by mouth daily. 90 tablet 4  . norelgestromin-ethinyl estradiol (XULANE) 150-35 MCG/24HR transdermal patch APPLY 1 PATCH ONCE A WEEK (Patient taking differently: Place 1 patch onto the skin See admin instructions. Apply 1 patch transdermally once a week for 3 weeks, off on 4th week) 3 patch 12  . ondansetron (ZOFRAN-ODT) 8 MG disintegrating tablet Take 1 tablet (8 mg total) by mouth every 8 (eight) hours as needed for nausea or vomiting. 20 tablet 2  . pantoprazole (PROTONIX) 40 MG tablet Take 1 tablet (40 mg total) by mouth daily before breakfast. Take 30 minutes before breakfast 90 tablet 3  . SUMAtriptan (IMITREX) 50 MG tablet TAKE 1 TABLET BY MOUTH EVERY 2 HRS AS NEEDED FOR MIGRAINE. MAY REPEAT 1X IN 2 HOURS IF HEADACHE PERSISTS OR RECURS. 10 tablet 1  . linaclotide (LINZESS) 290 MCG CAPS capsule Take 1 capsule (290 mcg total) by mouth daily. 30 capsule 5  . polyethylene glycol powder (GLYCOLAX/MIRALAX) 17 GM/SCOOP powder Take one capful once to twice daily for constipation 527 g 5   . promethazine (PHENERGAN) 12.5 MG tablet Take 1 tablet (12.5 mg total) by mouth every 6 (six) hours as needed for nausea or vomiting. 30 tablet 0   No current facility-administered medications for this visit.     Allergies as of 08/27/2018 - Review Complete 08/27/2018  Allergen Reaction Noted  . Motegrity [prucalopride succinate] Other (See Comments) 08/27/2018  . Penicillins Other (See Comments) 05/08/2016  . Grapeseed extract [nutritional supplements] Nausea And Vomiting 05/08/2016  . Morphine and related Hypertension 05/08/2016  . Latex Rash 05/08/2016  . Tape Rash 07/03/2018    ROS:  General: Negative for   fever, chills, fatigue, weakness.+weight loss, vomiting. Appetite good but can't eat. ENT: Negative for hoarseness, difficulty swallowing , nasal congestion. CV: Negative for chest pain, angina, palpitations, dyspnea on exertion, peripheral edema.  Respiratory: Negative for dyspnea at rest, dyspnea on exertion, cough, sputum, wheezing.  GI: See history of present illness.  GU:  Negative for dysuria, hematuria, urinary incontinence, urinary frequency, nocturnal urination.  Endo: Negative for unusual weight change.    Physical Examination:   BP 112/73   Pulse (!) 108   Temp (!) 97.3 F (36.3 C) (Temporal)   Ht 5\' 2"  (1.575 m)   Wt 180 lb 3.2 oz (81.7 kg)   LMP 07/20/2018 (Approximate)   BMI 32.96 kg/m   General: Well-nourished, well-developed in no acute distress. Appears comfortable. Eyes: No icterus. Mouth: masked. Abdomen: Bowel sounds are normal, nondistended, no hepatosplenomegaly or masses, no abdominal bruits or hernia , no rebound or guarding.  Mild tenderness lower abdomen. Mild epig tenderness Extremities: No lower extremity edema. No clubbing or deformities. Neuro: Alert and oriented x 4   Skin: Warm and dry, no jaundice.   Psych: Alert and cooperative, normal mood and affect.  Labs:  Lab Results  Component Value Date   CREATININE 0.54 08/14/2018    BUN 7 08/14/2018   NA 139 08/14/2018   K 4.1 08/14/2018   CL 106 08/14/2018   CO2 25 08/14/2018   Lab Results  Component Value Date   ALT 17 07/09/2018   AST 18 07/09/2018   ALKPHOS 81 07/09/2018   BILITOT 0.6 07/09/2018   Lab Results  Component Value Date   WBC 8.4 08/14/2018   HGB 14.0 08/14/2018   HCT 42.9 08/14/2018   MCV 88.1 08/14/2018   PLT 257 08/14/2018   Lab Results  Component Value Date   TSH 1.434 08/14/2018   Lab Results  Component Value Date   LIPASE 22 07/09/2018     Imaging Studies: Dg Abd 2 Views  Result Date: 08/14/2018 CLINICAL DATA:  Abdominal pain and constipation EXAM: ABDOMEN - 2 VIEW COMPARISON:  Two days ago FINDINGS: Moderate formed stool within the colon. No rectal impaction or evident bowel obstruction. No concerning mass effect or calcification. No pneumoperitoneum. Lung bases are clear. Mild lumbar levoscoliosis. IMPRESSION: Moderate stool volume without obstruction or rectal impaction. Electronically Signed   By: Monte Fantasia M.D.   On: 08/14/2018 11:12

## 2018-08-27 NOTE — Telephone Encounter (Signed)
I mailed release to patient and have not received it back. She has OV today and I have put the Release on her chart to sign so I can get medical records.

## 2018-08-27 NOTE — Telephone Encounter (Signed)
Noted  

## 2018-08-28 ENCOUNTER — Other Ambulatory Visit: Payer: Self-pay | Admitting: *Deleted

## 2018-08-28 ENCOUNTER — Telehealth: Payer: Self-pay | Admitting: *Deleted

## 2018-08-28 ENCOUNTER — Encounter: Payer: Self-pay | Admitting: *Deleted

## 2018-08-28 DIAGNOSIS — R112 Nausea with vomiting, unspecified: Secondary | ICD-10-CM

## 2018-08-28 DIAGNOSIS — R634 Abnormal weight loss: Secondary | ICD-10-CM

## 2018-08-28 DIAGNOSIS — K5900 Constipation, unspecified: Secondary | ICD-10-CM

## 2018-08-28 NOTE — Telephone Encounter (Signed)
REVIEWED-NO ADDITIONAL RECOMMENDATIONS. 

## 2018-08-28 NOTE — Telephone Encounter (Signed)
Spoke with [place] in Endo. Patient can have camera pill done 8/31 at 7:30am.   Called patient. She is scheduled for 8/31. Discussed instructions with patient in detail. Aware to stop protonix 7 days prior to test. Instructions mailed. Orders entered.  Checked evicore website and no PA is required.

## 2018-08-28 NOTE — Assessment & Plan Note (Signed)
Persistent abdominal pain, predominantly right lower quadrant extending throughout the lower abdomen into the back.  Associated with constipation/obstipation, vomiting, weight loss.  Has passed some liquid stool with MiraLAX and mag citrate.  Some liquid stool with Linzess and Amitiza.  Continues to feel constipated however without significant results.  MiraLAX purge ineffective.  She took half a gallon of TriLyte without a bowel movement.  No BM on Trulance for several days.  CT has been done.  Multiple abdominal films with moderate stool.  No obstruction seen.  Discussed with Dr. Oneida Alar.  Discontinue Levsin and Zofran which may be slowing GI motility. Stop Trulance. Restart Linzess 258mcg daily. Take Miralax 17 grams twice per day. Phenergan can be used for significant nausea. Abdominal xray to check on amount of stool present. Smart Pill to evaluate your GI motility.   Goal of colonoscopy for change in bowel habits, rectal bleeding but need to manage constipation adequately first.

## 2018-08-28 NOTE — Telephone Encounter (Signed)
Make sure SLF aware. I don't read these.

## 2018-09-08 ENCOUNTER — Other Ambulatory Visit: Payer: Self-pay

## 2018-09-08 ENCOUNTER — Encounter: Payer: Self-pay | Admitting: Family Medicine

## 2018-09-08 NOTE — Telephone Encounter (Signed)
Patient called and appointment scheduled.

## 2018-09-09 ENCOUNTER — Other Ambulatory Visit: Payer: Self-pay

## 2018-09-09 ENCOUNTER — Encounter: Payer: Self-pay | Admitting: Family

## 2018-09-09 ENCOUNTER — Ambulatory Visit (INDEPENDENT_AMBULATORY_CARE_PROVIDER_SITE_OTHER): Payer: Medicaid Other | Admitting: Family

## 2018-09-09 VITALS — BP 127/84 | HR 109 | Temp 98.0°F | Ht 62.0 in | Wt 177.2 lb

## 2018-09-09 DIAGNOSIS — R350 Frequency of micturition: Secondary | ICD-10-CM

## 2018-09-09 DIAGNOSIS — N3000 Acute cystitis without hematuria: Secondary | ICD-10-CM

## 2018-09-09 LAB — MICROSCOPIC EXAMINATION: Renal Epithel, UA: NONE SEEN /hpf

## 2018-09-09 LAB — URINALYSIS, COMPLETE
Bilirubin, UA: NEGATIVE
Glucose, UA: NEGATIVE
Ketones, UA: NEGATIVE
Leukocytes,UA: NEGATIVE
Nitrite, UA: NEGATIVE
Protein,UA: NEGATIVE
Specific Gravity, UA: 1.025 (ref 1.005–1.030)
Urobilinogen, Ur: 0.2 mg/dL (ref 0.2–1.0)
pH, UA: 7.5 (ref 5.0–7.5)

## 2018-09-09 MED ORDER — SULFAMETHOXAZOLE-TRIMETHOPRIM 800-160 MG PO TABS: 1.0000 | ORAL_TABLET | Freq: Two times a day (BID) | ORAL | 0 refills | Status: DC

## 2018-09-09 NOTE — Progress Notes (Signed)
   Subjective:    Patient ID: Amber Marsh, female    DOB: [birth year], 23 y.o.   MRN: [id number]  Chief Complaint  Patient presents with  . pain passing urine    Dysuria  This is a recurrent problem. The current episode started 1 to 4 weeks ago. The problem occurs every urination. The problem has been unchanged. The quality of the pain is described as burning. The pain is at a severity of 6/10. The pain is moderate. There has been no fever. Associated symptoms include frequency, hesitancy and urgency. Pertinent negatives include no chills, discharge, flank pain, hematuria, nausea or vomiting.      Review of Systems  Constitutional: Negative for chills.  Gastrointestinal: Negative for nausea and vomiting.  Genitourinary: Positive for dysuria, frequency, hesitancy and urgency. Negative for flank pain and hematuria.  All other systems reviewed and are negative.      Objective:   Physical Exam Vitals signs reviewed.  Constitutional:      General: She is not in acute distress.    Appearance: She is well-developed.  HENT:     Head: Normocephalic and atraumatic.  Eyes:     Pupils: Pupils are equal, round, and reactive to light.  Neck:     Musculoskeletal: Normal range of motion and neck supple.     Thyroid: No thyromegaly.  Cardiovascular:     Rate and Rhythm: Normal rate and regular rhythm.     Heart sounds: Normal heart sounds. No murmur.  Pulmonary:     Effort: Pulmonary effort is normal. No respiratory distress.     Breath sounds: Normal breath sounds. No wheezing.  Abdominal:     General: Bowel sounds are normal. There is no distension.     Palpations: Abdomen is soft.     Tenderness: There is no abdominal tenderness.  Musculoskeletal: Normal range of motion.        General: No tenderness.  Skin:    General: Skin is warm and dry.  Neurological:     Mental Status: She is alert and oriented to person, place, and time.     Cranial Nerves: No cranial nerve deficit.      Deep Tendon Reflexes: Reflexes are normal and symmetric.  Psychiatric:        Behavior: Behavior normal.        Thought Content: Thought content normal.        Judgment: Judgment normal.     BP 127/84   Pulse (!) 109   Temp 98 F (36.7 C) (Temporal)   Ht 5\' 2"  (1.575 m)   Wt 177 lb 3.2 oz (80.4 kg)   LMP 08/12/2018 (Approximate)   BMI 32.41 kg/m      Assessment & Plan:  Amber Marsh comes in today with chief complaint of pain passing urine   Diagnosis and orders addressed:  1. Urine frequency - Urinalysis, Complete  2. Acute cystitis without hematuria Force fluids AZO over the counter X2 days RTO prn Culture pending - sulfamethoxazole-trimethoprim (BACTRIM DS) 800-160 MG tablet; Take 1 tablet by mouth 2 (two) times daily.  Dispense: 14 tablet; Refill: 0 - Urine Culture  Amber Dun, FNP

## 2018-09-09 NOTE — Patient Instructions (Signed)

## 2018-09-12 LAB — URINE CULTURE

## 2018-09-15 ENCOUNTER — Telehealth: Payer: Self-pay | Admitting: Gastroenterology

## 2018-09-15 ENCOUNTER — Encounter (HOSPITAL_COMMUNITY)
Admission: RE | Disposition: A | Discharge status: Home / Self Care | Payer: Self-pay | Source: Home / Self Care | Attending: Gastroenterology

## 2018-09-15 ENCOUNTER — Ambulatory Visit (HOSPITAL_COMMUNITY)
Admission: RE | Admit: 2018-09-15 | Discharge: 2018-09-15 | Disposition: A | Discharge status: Home / Self Care | Payer: Medicaid Other | Attending: Gastroenterology | Admitting: Gastroenterology

## 2018-09-15 DIAGNOSIS — R1031 Right lower quadrant pain: Secondary | ICD-10-CM

## 2018-09-15 DIAGNOSIS — Z5309 Procedure and treatment not carried out because of other contraindication: Secondary | ICD-10-CM | POA: Insufficient documentation

## 2018-09-15 DIAGNOSIS — R112 Nausea with vomiting, unspecified: Secondary | ICD-10-CM

## 2018-09-15 DIAGNOSIS — R634 Abnormal weight loss: Secondary | ICD-10-CM

## 2018-09-15 SURGERY — CAPSULE ENDOSCOPY, USING SMARTPILL MOTILITY TESTING SYSTEM

## 2018-09-15 NOTE — Telephone Encounter (Signed)
I returned father's VM and left message that I was returning his call, but that I really need to speak with pt so I will call her.  I called pt and she said she is not getting any better.  She cannot eat and mostly just drinks water and ice chips and sucks on hard candy.   Amber Marsh, please advise!

## 2018-09-15 NOTE — Addendum Note (Signed)
Addended by: Zara Council C on: 09/15/2018 01:30 PM   Modules accepted: Orders

## 2018-09-15 NOTE — Telephone Encounter (Signed)
PATIENT FATHER CALLED TO DISCUSS HER PROCEDURE-SAID SHE CAN NOT HAVE ANYTHING DONE BECAUSE SHE CANT [place] ANYTHING [phone]

## 2018-09-15 NOTE — Telephone Encounter (Signed)
PA for CT submitted via [employer] website. Case pending. Clinical notes uploaded to case for review. Service order: [id number].

## 2018-09-15 NOTE — OR Nursing (Signed)
Patient in for a Smart pill. Patient states she took [place] yesterday. Patient should have stopped X 48 hours. Spoke with Neil Crouch, PA. Will cancel procedure for today and reschedule for tomorrow at 0800. This will allow for the 48 hours since Linzess. Patient aware and will return tomorrow.

## 2018-09-15 NOTE — Telephone Encounter (Signed)
Spoke to patient.   She is supposed to have smart pill test tomorrow (rescheduled from today). She was told about the bar she has to eat for the test and she is confident that she will throw it up.  Will plan to cancel smart pill for now.   She is complaining of ongoing RLQ abd pain, N/V, 30 pound weight loss. She is not able to eat anything solid. Sucking on hard candy, drinking sips of Gatorade, flavored water. Passing liquid stool maybe once per week, small amount, pain after BM.   Birth control [place], on for awhile.      CANCEL SMART PILL. SCHEDULE CT ABD/PELVIS WITH CONTRAST ASAP FOR TOMORROW MORNING. DX: RLQ PAIN, VOMITING, 30 POUND WEIGHT LOSS

## 2018-09-15 NOTE — Telephone Encounter (Signed)
Endo scheduler informed to cancel smart pill appt.

## 2018-09-16 ENCOUNTER — Ambulatory Visit (HOSPITAL_COMMUNITY)
Admission: RE | Admit: 2018-09-16 | Discharge: 2018-09-16 | Disposition: A | Discharge status: Home / Self Care | Payer: Medicaid Other | Source: Ambulatory Visit | Attending: Gastroenterology | Admitting: Gastroenterology

## 2018-09-16 ENCOUNTER — Encounter (HOSPITAL_COMMUNITY): Admission: RE | Payer: Self-pay | Source: Home / Self Care

## 2018-09-16 ENCOUNTER — Other Ambulatory Visit: Payer: Self-pay

## 2018-09-16 ENCOUNTER — Ambulatory Visit (HOSPITAL_COMMUNITY): Admission: RE | Admit: 2018-09-16 | Payer: Medicaid Other | Source: Home / Self Care | Admitting: Gastroenterology

## 2018-09-16 DIAGNOSIS — R112 Nausea with vomiting, unspecified: Secondary | ICD-10-CM | POA: Diagnosis not present

## 2018-09-16 DIAGNOSIS — R634 Abnormal weight loss: Secondary | ICD-10-CM | POA: Insufficient documentation

## 2018-09-16 DIAGNOSIS — R1031 Right lower quadrant pain: Secondary | ICD-10-CM | POA: Diagnosis not present

## 2018-09-16 DIAGNOSIS — R109 Unspecified abdominal pain: Secondary | ICD-10-CM | POA: Diagnosis not present

## 2018-09-16 SURGERY — CAPSULE ENDOSCOPY, USING SMARTPILL MOTILITY TESTING SYSTEM

## 2018-09-16 MED ORDER — IOHEXOL 300 MG/ML  SOLN
100.0000 mL | Freq: Once | INTRAMUSCULAR | Status: AC | PRN
  Administered 2018-09-16: 12:00:00 100 mL via INTRAVENOUS

## 2018-09-16 MED ORDER — IOHEXOL 300 MG/ML  SOLN
30.0000 mL | Freq: Once | INTRAMUSCULAR | Status: AC | PRN
  Administered 2018-09-16: 30 mL via ORAL

## 2018-09-16 NOTE — Telephone Encounter (Signed)
CT approved. PA# [id number], valid 09/15/18-03/14/19.

## 2018-09-16 NOTE — Telephone Encounter (Signed)
Spoke to pt, she last had some water approx 3:00am.  [employer], pt can go now to [facility] radiology for CT. Called and informed pt. Advised her to be NPO and she will be given oral contrast prior to being scanned.

## 2018-09-18 ENCOUNTER — Ambulatory Visit: Payer: Medicaid Other | Admitting: Gastroenterology

## 2018-09-18 ENCOUNTER — Other Ambulatory Visit: Payer: Self-pay

## 2018-09-18 DIAGNOSIS — R109 Unspecified abdominal pain: Secondary | ICD-10-CM

## 2018-09-18 DIAGNOSIS — R634 Abnormal weight loss: Secondary | ICD-10-CM

## 2018-09-18 DIAGNOSIS — R112 Nausea with vomiting, unspecified: Secondary | ICD-10-CM

## 2018-09-24 ENCOUNTER — Other Ambulatory Visit (HOSPITAL_COMMUNITY)
Admission: RE | Admit: 2018-09-24 | Discharge: 2018-09-24 | Disposition: A | Discharge status: Home / Self Care | Payer: Medicaid Other | Source: Ambulatory Visit | Attending: Gastroenterology | Admitting: Gastroenterology

## 2018-09-24 ENCOUNTER — Other Ambulatory Visit: Payer: Self-pay

## 2018-09-24 DIAGNOSIS — Z01812 Encounter for preprocedural laboratory examination: Secondary | ICD-10-CM | POA: Insufficient documentation

## 2018-09-24 DIAGNOSIS — Z20828 Contact with and (suspected) exposure to other viral communicable diseases: Secondary | ICD-10-CM | POA: Diagnosis not present

## 2018-09-24 LAB — SARS CORONAVIRUS 2 (TAT 6-24 HRS): SARS Coronavirus 2: NEGATIVE

## 2018-09-25 ENCOUNTER — Telehealth: Payer: Self-pay | Admitting: *Deleted

## 2018-09-25 NOTE — Telephone Encounter (Signed)
Called patient to see if she could move up on schedule for 9/11. Received message the person is N/A, please try your call again later

## 2018-09-25 NOTE — Telephone Encounter (Signed)
Spoke with patient. She was agreeable to having procedure moved up to 10:30am tomorrow. Patient aware to arrive at 9:30am. She can have clear liquids until midnight and nothing after midnight. She voiced understanding. Called endo and made kim aware

## 2018-09-26 ENCOUNTER — Ambulatory Visit (HOSPITAL_COMMUNITY)
Admission: RE | Admit: 2018-09-26 | Discharge: 2018-09-26 | Disposition: A | Discharge status: Home / Self Care | Payer: Medicaid Other | Attending: Gastroenterology | Admitting: Gastroenterology

## 2018-09-26 ENCOUNTER — Encounter: Payer: Self-pay | Admitting: *Deleted

## 2018-09-26 ENCOUNTER — Encounter (HOSPITAL_COMMUNITY)
Admission: RE | Disposition: A | Discharge status: Home / Self Care | Payer: Self-pay | Source: Home / Self Care | Attending: Gastroenterology

## 2018-09-26 ENCOUNTER — Other Ambulatory Visit (HOSPITAL_COMMUNITY): Payer: Self-pay | Admitting: *Deleted

## 2018-09-26 ENCOUNTER — Other Ambulatory Visit: Payer: Self-pay

## 2018-09-26 ENCOUNTER — Telehealth: Payer: Self-pay | Admitting: Gastroenterology

## 2018-09-26 ENCOUNTER — Encounter (HOSPITAL_COMMUNITY): Payer: Self-pay | Admitting: *Deleted

## 2018-09-26 ENCOUNTER — Other Ambulatory Visit: Payer: Self-pay | Admitting: *Deleted

## 2018-09-26 DIAGNOSIS — G43909 Migraine, unspecified, not intractable, without status migrainosus: Secondary | ICD-10-CM | POA: Insufficient documentation

## 2018-09-26 DIAGNOSIS — Z8249 Family history of ischemic heart disease and other diseases of the circulatory system: Secondary | ICD-10-CM | POA: Diagnosis not present

## 2018-09-26 DIAGNOSIS — R1115 Cyclical vomiting syndrome unrelated to migraine: Secondary | ICD-10-CM | POA: Diagnosis present

## 2018-09-26 DIAGNOSIS — Z880 Allergy status to penicillin: Secondary | ICD-10-CM | POA: Diagnosis not present

## 2018-09-26 DIAGNOSIS — R112 Nausea with vomiting, unspecified: Secondary | ICD-10-CM | POA: Diagnosis not present

## 2018-09-26 DIAGNOSIS — Z8049 Family history of malignant neoplasm of other genital organs: Secondary | ICD-10-CM | POA: Insufficient documentation

## 2018-09-26 DIAGNOSIS — Z91048 Other nonmedicinal substance allergy status: Secondary | ICD-10-CM | POA: Insufficient documentation

## 2018-09-26 DIAGNOSIS — Z833 Family history of diabetes mellitus: Secondary | ICD-10-CM | POA: Insufficient documentation

## 2018-09-26 DIAGNOSIS — Z6829 Body mass index (BMI) 29.0-29.9, adult: Secondary | ICD-10-CM | POA: Insufficient documentation

## 2018-09-26 DIAGNOSIS — R634 Abnormal weight loss: Secondary | ICD-10-CM | POA: Insufficient documentation

## 2018-09-26 DIAGNOSIS — Z91018 Allergy to other foods: Secondary | ICD-10-CM | POA: Insufficient documentation

## 2018-09-26 DIAGNOSIS — Z888 Allergy status to other drugs, medicaments and biological substances status: Secondary | ICD-10-CM | POA: Insufficient documentation

## 2018-09-26 DIAGNOSIS — Z91040 Latex allergy status: Secondary | ICD-10-CM | POA: Diagnosis not present

## 2018-09-26 DIAGNOSIS — R109 Unspecified abdominal pain: Secondary | ICD-10-CM

## 2018-09-26 DIAGNOSIS — F17210 Nicotine dependence, cigarettes, uncomplicated: Secondary | ICD-10-CM | POA: Insufficient documentation

## 2018-09-26 DIAGNOSIS — R519 Headache, unspecified: Secondary | ICD-10-CM

## 2018-09-26 DIAGNOSIS — J45990 Exercise induced bronchospasm: Secondary | ICD-10-CM | POA: Insufficient documentation

## 2018-09-26 DIAGNOSIS — Z842 Family history of other diseases of the genitourinary system: Secondary | ICD-10-CM | POA: Diagnosis not present

## 2018-09-26 HISTORY — PX: ESOPHAGOGASTRODUODENOSCOPY: SHX5428

## 2018-09-26 HISTORY — PX: BIOPSY: SHX5522

## 2018-09-26 SURGERY — EGD (ESOPHAGOGASTRODUODENOSCOPY)
Anesthesia: Moderate Sedation

## 2018-09-26 MED ORDER — LIDOCAINE VISCOUS HCL 2 % MT SOLN
OROMUCOSAL | Status: AC
  Filled 2018-09-26: qty 15

## 2018-09-26 MED ORDER — PROMETHAZINE HCL 25 MG/ML IJ SOLN
INTRAMUSCULAR | Status: AC
  Filled 2018-09-26: qty 1

## 2018-09-26 MED ORDER — MIDAZOLAM HCL 5 MG/5ML IJ SOLN
INTRAMUSCULAR | Status: DC | PRN
  Administered 2018-09-26 (×2): 2 mg via INTRAVENOUS

## 2018-09-26 MED ORDER — LIDOCAINE VISCOUS HCL 2 % MT SOLN
OROMUCOSAL | Status: DC | PRN
  Administered 2018-09-26: 1 via OROMUCOSAL

## 2018-09-26 MED ORDER — SODIUM CHLORIDE 0.9 % IV SOLN
INTRAVENOUS | Status: DC
  Administered 2018-09-26: 10:00:00 via INTRAVENOUS

## 2018-09-26 MED ORDER — MEPERIDINE HCL 100 MG/ML IJ SOLN
INTRAMUSCULAR | Status: DC | PRN
  Administered 2018-09-26 (×2): 25 mg via INTRAVENOUS
  Administered 2018-09-26: 50 mg via INTRAVENOUS

## 2018-09-26 MED ORDER — SODIUM CHLORIDE 0.9% FLUSH
INTRAVENOUS | Status: AC
  Filled 2018-09-26: qty 10

## 2018-09-26 MED ORDER — PROMETHAZINE HCL 25 MG/ML IJ SOLN
12.5000 mg | Freq: Once | INTRAMUSCULAR | Status: AC
  Administered 2018-09-26: 10:00:00 12.5 mg via INTRAVENOUS

## 2018-09-26 MED ORDER — MINERAL OIL PO OIL
TOPICAL_OIL | ORAL | Status: AC
  Filled 2018-09-26: qty 30

## 2018-09-26 MED ORDER — MIDAZOLAM HCL 5 MG/5ML IJ SOLN
INTRAMUSCULAR | Status: AC
  Filled 2018-09-26: qty 10

## 2018-09-26 MED ORDER — METOCLOPRAMIDE HCL 5 MG PO TABS: ORAL_TABLET | ORAL | 11 refills | Status: DC

## 2018-09-26 MED ORDER — MEPERIDINE HCL 100 MG/ML IJ SOLN
INTRAMUSCULAR | Status: AC
  Filled 2018-09-26: qty 2

## 2018-09-26 MED ORDER — STERILE WATER FOR IRRIGATION IR SOLN
Status: DC | PRN
  Administered 2018-09-26: 1.5 mL

## 2018-09-26 NOTE — H&P (View-Only) (Signed)
Primary Care Physician:  Raliegh IpGottschalk, Amber Marsh, Amber Marsh Primary Gastroenterologist:  Dr. Darrick PennaFields  Pre-Procedure History & Physical: HPI:  Amber Marsh is a 23 y.o. female here for NAUSEA/VOMITING.  Past Medical History:  Diagnosis Date  . Allergy    seasonal allergy  . Appendicitis 05/2016  . Asthma    exercise induced  . Constipation   . Migraines     Past Surgical History:  Procedure Laterality Date  . APPENDECTOMY  06/10/2016  . ELBOW SURGERY Right    in elementary school  . ESOPHAG[facility]GASTRODUODENOSCOPY  [facility]  . KNEE SURGERY Right 2018  . LAPAROSCOPIC APPENDECTOMY N/A 06/10/2016   Procedure: APPENDECTOMY LAPAROSCOPIC OPEN;  Surgeon: Harriette Bouillonornett, Thomas, MD;  Location: [facility] OR;  Service: General;  Laterality: N/A;    Prior to Admission medications   Medication Sig Start Date End Date Taking? Authorizing Provider  albuterol (PROVENTIL HFA;VENTOLIN HFA) 108 (90 Base) MCG/ACT inhaler Inhale 1-2 puffs into the lungs every 4 (four) hours as needed for wheezing or shortness of breath. 01/24/18  Yes Marsh, Amber RhodesAshly Marsh, Amber Marsh  ibuprofen (ADVIL) 600 MG tablet Take 600 mg by mouth every 6 (six) hours as needed for headache or mild pain (or migraine-related pain).  04/09/18  Yes [provider]  linaclotide (LINZESS) 290 MCG CAPS capsule Take 1 capsule (290 mcg total) by mouth daily. 08/27/18  Yes Tiffany KocherLewis, Leslie S, PA-C  loratadine (CLARITIN) 10 MG tablet Take 1 tablet (10 mg total) by mouth daily. 01/24/18  Yes Marsh, Amber RhodesAshly Marsh, Amber Marsh  norelgestromin-ethinyl estradiol [employer]r [employer]ico(XULANE) 150-35 MCG/24HR transdermal patch APPLY 1 PATCH ONCE A WEEK Patient taking differently: Place 1 patch onto the skin See admin instructions. Apply 1 patch transdermally once a week for 3 weeks, off on 4th week 01/24/18  Yes Marsh, Amber Marsh, Amber Marsh  pantoprazole (PROTONIX) 40 MG tablet Take 1 tablet (40 mg total) by mouth daily before breakfast. Take 30 minutes before breakfast 08/14/18  Yes  Clearance CootsHarper, Kristen S, PA-C  polyethylene glycol powder (GLYCOLAX/MIRALAX) 17 GM/SCOOP powder Take one capful once to twice daily for constipation 08/27/18  Yes Tiffany KocherLewis, Leslie S, PA-C  promethazine (PHENERGAN) 12.5 MG tablet Take 1 tablet (12.5 mg total) by mouth every 6 (six) hours as needed for nausea or vomiting. 08/27/18  Yes Tiffany KocherLewis, Leslie S, PA-C  sulfamethoxazole-trimethoprim (BACTRIM DS) 800-160 MG tablet Take 1 tablet by mouth 2 (two) times daily. 09/09/18   Jannifer RodneyHawks, Christy A, FNP  SUMAtriptan (IMITREX) 50 MG tablet TAKE 1 TABLET BY MOUTH EVERY 2 HRS AS NEEDED FOR MIGRAINE. MAY REPEAT 1X IN 2 HOURS IF HEADACHE PERSISTS OR RECURS. 07/17/18   Delynn FlavinGottschalk, Amber Marsh, Amber Marsh    Allergies as of 09/18/2018 - Review Complete 09/15/2018  Allergen Reaction Noted  . Motegrity [prucalopride succinate] Other (See Comments) 08/27/2018  . Penicillins Other (See Comments) 05/08/2016  . Grapeseed extract [nutritional supplements] Nausea And Vomiting 05/08/2016  . Morphine and related Hypertension 05/08/2016  . Latex Rash 05/08/2016  . Tape Rash 07/03/2018    Family History  Problem Relation Age of Onset  . Migraines Mother   . Ovarian cysts Mother   . Hypertension Father   . Hypertension Maternal Grandmother   . Migraines Maternal Grandmother   . Cervical cancer Maternal Grandmother   . Diabetes Paternal Grandmother   . Crohn's disease Neg Hx   . Colon cancer Neg Hx   . Ulcerative colitis Neg Hx     Social History   Socioeconomic History  . Marital status:  Significant Other    Spouse name: Not on file  . Number of children: 0  . Years of education: Not on file  . Highest education level: Not on file  Occupational History  . Occupation: Educational psychologist    Comment: [employer]  Social Needs  . Financial resource strain: Not on file  . Food insecurity    Worry: Not on file    Inability: Not on file  . Transportation needs    Medical: Not on file    Non-medical: Not on file  Tobacco Use  . Smoking  status: Current Every Day Smoker    Packs/day: 0.50    Years: 3.00    Pack years: 1.50    Types: Cigarettes  . Smokeless tobacco: Never Used  . Tobacco comment: one pack every 3 days  Substance and Sexual Activity  . Alcohol use: Yes    Comment: occasional  . Drug use: No  . Sexual activity: Yes    Birth control/protection: Patch  Lifestyle  . Physical activity    Days per week: Not on file    Minutes per session: Not on file  . Stress: Not on file  Relationships  . Social Herbalist on phone: Not on file    Gets together: Not on file    Attends religious service: Not on file    Active member of club or organization: Not on file    Attends meetings of clubs or organizations: Not on file    Relationship status: Not on file  . Intimate partner violence    Fear of current or ex partner: Not on file    Emotionally abused: Not on file    Physically abused: Not on file    Forced sexual activity: Not on file  Other Topics Concern  . Not on file  Social History Narrative  . Not on file    Review of Systems: See HPI, otherwise negative ROS   Physical Exam: BP 101/61   Pulse 71   Resp 19   Ht 5\' 2"  (1.575 Marsh)   Wt 72.6 kg   LMP 09/08/2018   SpO2 100%   BMI 29.26 kg/Marsh  General:   Alert,  pleasant and cooperative in NAD Head:  Normocephalic and atraumatic. Neck:  Supple; Lungs:  Clear throughout to auscultation.    Heart:  Regular rate and rhythm. Abdomen:  Soft, nontender and nondistended. Normal bowel sounds, without guarding, and without rebound.   Neurologic:  Alert and  oriented x4;  grossly normal neurologically.  Impression/Plan:     NAUSEA/VOMITING  PLAN: EGD TODAY.  DISCUSSED PROCEDURE, BENEFITS, & RISKS: < 1% chance of medication reaction, bleeding, perforation, or ASPIRATION.

## 2018-09-26 NOTE — H&P (Signed)
Primary Care Physician:  Gottschalk, Ashly M, DO Primary Gastroenterologist:  Dr. Ashaki Frosch  Pre-Procedure History & Physical: HPI:  Amber Marsh is a 22 y.o. female here for NAUSEA/VOMITING.  Past Medical History:  Diagnosis Date  . Allergy    seasonal allergy  . Appendicitis 05/2016  . Asthma    exercise induced  . Constipation   . Migraines     Past Surgical History:  Procedure Laterality Date  . APPENDECTOMY  06/10/2016  . ELBOW SURGERY Right    in elementary school  . ESOPHAGOGASTRODUODENOSCOPY  2016   [facility], [place]  . KNEE SURGERY Right 2018  . LAPAROSCOPIC APPENDECTOMY N/A 06/10/2016   Procedure: APPENDECTOMY LAPAROSCOPIC OPEN;  Surgeon: Cornett, Thomas, MD;  Location: [facility] OR;  Service: General;  Laterality: N/A;    Prior to Admission medications   Medication Sig Start Date End Date Taking? Authorizing Provider  albuterol (PROVENTIL HFA;VENTOLIN HFA) 108 (90 Base) MCG/ACT inhaler Inhale 1-2 puffs into the lungs every 4 (four) hours as needed for wheezing or shortness of breath. 01/24/18  Yes Gottschalk, Ashly M, DO  ibuprofen (ADVIL) 600 MG tablet Take 600 mg by mouth every 6 (six) hours as needed for headache or mild pain (or migraine-related pain).  04/09/18  Yes [provider]  linaclotide (LINZESS) 290 MCG CAPS capsule Take 1 capsule (290 mcg total) by mouth daily. 08/27/18  Yes Lewis, Leslie S, PA-C  loratadine (CLARITIN) 10 MG tablet Take 1 tablet (10 mg total) by mouth daily. 01/24/18  Yes Gottschalk, Ashly M, DO  norelgestromin-ethinyl estradiol (XULANE) 150-35 MCG/24HR transdermal patch APPLY 1 PATCH ONCE A WEEK Patient taking differently: Place 1 patch onto the skin See admin instructions. Apply 1 patch transdermally once a week for 3 weeks, off on 4th week 01/24/18  Yes Gottschalk, Ashly M, DO  pantoprazole (PROTONIX) 40 MG tablet Take 1 tablet (40 mg total) by mouth daily before breakfast. Take 30 minutes before breakfast 08/14/18  Yes  Harper, Kristen S, PA-C  polyethylene glycol powder (GLYCOLAX/MIRALAX) 17 GM/SCOOP powder Take one capful once to twice daily for constipation 08/27/18  Yes Lewis, Leslie S, PA-C  promethazine (PHENERGAN) 12.5 MG tablet Take 1 tablet (12.5 mg total) by mouth every 6 (six) hours as needed for nausea or vomiting. 08/27/18  Yes Lewis, Leslie S, PA-C  sulfamethoxazole-trimethoprim (BACTRIM DS) 800-160 MG tablet Take 1 tablet by mouth 2 (two) times daily. 09/09/18   Hawks, Christy A, FNP  SUMAtriptan (IMITREX) 50 MG tablet TAKE 1 TABLET BY MOUTH EVERY 2 HRS AS NEEDED FOR MIGRAINE. MAY REPEAT 1X IN 2 HOURS IF HEADACHE PERSISTS OR RECURS. 07/17/18   Gottschalk, Ashly M, DO    Allergies as of 09/18/2018 - Review Complete 09/15/2018  Allergen Reaction Noted  . Motegrity [prucalopride succinate] Other (See Comments) 08/27/2018  . Penicillins Other (See Comments) 05/08/2016  . Grapeseed extract [nutritional supplements] Nausea And Vomiting 05/08/2016  . Morphine and related Hypertension 05/08/2016  . Latex Rash 05/08/2016  . Tape Rash 07/03/2018    Family History  Problem Relation Age of Onset  . Migraines Mother   . Ovarian cysts Mother   . Hypertension Father   . Hypertension Maternal Grandmother   . Migraines Maternal Grandmother   . Cervical cancer Maternal Grandmother   . Diabetes Paternal Grandmother   . Crohn's disease Neg Hx   . Colon cancer Neg Hx   . Ulcerative colitis Neg Hx     Social History   Socioeconomic History  . Marital status:   Significant Other    Spouse name: Not on file  . Number of children: 0  . Years of education: Not on file  . Highest education level: Not on file  Occupational History  . Occupation: Educational psychologist    Comment: [place]  Social Needs  . Financial resource strain: Not on file  . Food insecurity    Worry: Not on file    Inability: Not on file  . Transportation needs    Medical: Not on file    Non-medical: Not on file  Tobacco Use  . Smoking  status: Current Every Day Smoker    Packs/day: 0.50    Years: 3.00    Pack years: 1.50    Types: Cigarettes  . Smokeless tobacco: Never Used  . Tobacco comment: one pack every 3 days  Substance and Sexual Activity  . Alcohol use: Yes    Comment: occasional  . Drug use: No  . Sexual activity: Yes    Birth control/protection: Patch  Lifestyle  . Physical activity    Days per week: Not on file    Minutes per session: Not on file  . Stress: Not on file  Relationships  . Social Herbalist on phone: Not on file    Gets together: Not on file    Attends religious service: Not on file    Active member of club or organization: Not on file    Attends meetings of clubs or organizations: Not on file    Relationship status: Not on file  . Intimate partner violence    Fear of current or ex partner: Not on file    Emotionally abused: Not on file    Physically abused: Not on file    Forced sexual activity: Not on file  Other Topics Concern  . Not on file  Social History Narrative  . Not on file    Review of Systems: See HPI, otherwise negative ROS   Physical Exam: BP 101/61   Pulse 71   Resp 19   Ht 5\' 2"  (1.575 m)   Wt 72.6 kg   LMP 09/08/2018   SpO2 100%   BMI 29.26 kg/m  General:   Alert,  pleasant and cooperative in NAD Head:  Normocephalic and atraumatic. Neck:  Supple; Lungs:  Clear throughout to auscultation.    Heart:  Regular rate and rhythm. Abdomen:  Soft, nontender and nondistended. Normal bowel sounds, without guarding, and without rebound.   Neurologic:  Alert and  oriented x4;  grossly normal neurologically.  Impression/Plan:     NAUSEA/VOMITING  PLAN: EGD TODAY.  DISCUSSED PROCEDURE, BENEFITS, & RISKS: < 1% chance of medication reaction, bleeding, perforation, or ASPIRATION.

## 2018-09-26 NOTE — Op Note (Signed)
[facility] Patient Name: Amber Marsh Procedure Date: 09/26/2018 10:18 AM MRN: [id number] Date of Birth: [birth year] Attending MD: Barney Drain MD, MD CSN: [id number] Age: 23 Admit Type: Outpatient Procedure:                Upper GI endoscopy WITH COLD FORCEPS BIOPSY Indications:              Persistent vomiting of unknown cause, Weight loss Providers:                Barney Drain MD, MD, Charlsie Quest. Theda Sers RN, RN,                            Aram Candela Referring MD:             Koleen Distance. Gottschalk Medicines:                Promethazine 12.5 mg IV, Meperidine 100 mg IV,                            Midazolam 6 mg IV Complications:            No immediate complications. Estimated Blood Loss:     Estimated blood loss was minimal. Procedure:                Pre-Anesthesia Assessment:                           - Prior to the procedure, a History and Physical                            was performed, and patient medications and                            allergies were reviewed. The patient's tolerance of                            previous anesthesia was also reviewed. The risks                            and benefits of the procedure and the sedation                            options and risks were discussed with the patient.                            All questions were answered, and informed consent                            was obtained. Prior Anticoagulants: The patient has                            taken no previous anticoagulant or antiplatelet                            agents except for NSAID medication. ASA Grade  Assessment: II - A patient with mild systemic                            disease. After reviewing the risks and benefits,                            the patient was deemed in satisfactory condition to                            undergo the procedure. After obtaining informed                            consent, the endoscope was passed  under direct                            vision. Throughout the procedure, the patient's                            blood pressure, pulse, and oxygen saturations were                            monitored continuously. The GIF-H190 ([id number]) was                            introduced through the mouth, and advanced to the                            duodenal bulb. The patient tolerated the procedure                            poorly due to the patient's combativeness. The                            upper GI endoscopy was performed with difficulty                            due to the patient's agitation. Successful                            completion of the procedure was aided by increasing                            the dose of sedation medication and receiving                            assistance from additional staff. Scope In: 10:56:30 AM Scope Out: 11:01:53 AM Total Procedure Duration: 0 hours 5 minutes 23 seconds  Findings:      The examined esophagus was normal.      The entire examined stomach was normal.      The examined duodenum was normal. Biopsies(4: 2ND PORTION, 2: BULB) for       histology were taken with a cold forceps for evaluation of celiac       disease. Impression:               -  Normal esophagus.                           - Normal stomach.                           - Normal examined duodenum.                           - NO SOURCE FOR VOMITING/WEIGHT LOSS IDENTIFIED.                            EXAM INCOMPLETE DUE TO PT AGITATION. Moderate Sedation:      Moderate (conscious) sedation was administered by the endoscopy nurse       and supervised by the endoscopist. The following parameters were       monitored: oxygen saturation, heart rate, blood pressure, and response       to care. Total physician intraservice time was 29 minutes. Recommendation:           - Patient has a contact number available for                            emergencies. The signs and symptoms  of potential                            delayed complications were discussed with the                            patient. Return to normal activities tomorrow.                            Written discharge instructions were provided to the                            patient.                           - Resume previous diet.                           - Continue present medications. ADD REGLAN 5 MG 30                            MINS BEFORE 1ST AND 2ND MEAL. STOP IBUPROFEN.                           - Await pathology results. CONSIDER CT OF HEAD, &                            PICC LINE/TPN.                           - Return to GI clinic at the next available                            appointment.                           -  Refer to a gastroenterologist at the next                            available appointment.                           - REPEAT EGD W/ MAC WITHIN TEH NEXT 1-2 WEEKS. Procedure Code(s):        --- Professional ---                           [phone], Esophagogastroduodenoscopy, flexible,                            transoral; with biopsy, single or multiple                           99153, Moderate sedation; each additional 15                            minutes intraservice time                           G0500, Moderate sedation services provided by the                            same physician or other qualified health care                            professional performing a gastrointestinal                            endoscopic service that sedation supports,                            requiring the presence of an independent trained                            observer to assist in the monitoring of the                            patient's level of consciousness and physiological                            status; initial 15 minutes of intra-service time;                            patient age 76 years or older (additional time may                            be reported with  [phone], as appropriate) Diagnosis Code(s):        --- Professional ---                           R63.4, Abnormal weight loss CPT copyright 2019 American Medical Association. All rights reserved. The codes documented in this  report are preliminary and upon coder review may  be revised to meet current compliance requirements. Barney Drain, MD Barney Drain MD, MD 09/26/2018 11:18:02 AM This report has been signed electronically. Number of Addenda: 0

## 2018-09-26 NOTE — Telephone Encounter (Signed)
Sep 17 EGD W/ MAC.   PT NEEDS WITHIN 7 DAYS A CT OF HEAD W/O CONTRAST,Dx: VOMITING, HEADACHES, WEIGHT LOSS. REFER PT TO [facility] GI DEPT, DR. Derrill Kay, Dx: VOMITING, WEIGHT LOSS, CONSTIPATION.

## 2018-09-26 NOTE — Telephone Encounter (Signed)
Called endo. Dr. Laural Golden has MAC cases that day. Can do 9/17 at 7:30 or 9/29 at 3:00pm?

## 2018-09-26 NOTE — Discharge Instructions (Signed)
WE COULD NOT DO YOUR ENDOSCOPY BECAUSE YOU WERE PULLING OUT THE SCOPE. YOUR EXAM APPEARED NORMAL BUT WAS INCOMPLETE. I BIOPSIED YOUR SMALL BOWEL.    COMPLETE  UPPER ENDOSCOPY ON SEP 17 AT 0730. YOU NEED TO RETURN FOR ENDOSCOPY WITH PROPOFOL.  COMPLETE CT OF THE HEAD WITHIN 7 DAYS.  I AM REFERRING YOU TO DR. Milderd Meager, [facility], FOR A 2ND OPINION.   USING A SYRINGE, DRINK CARNATION INSTANT BREAKFAST, BOOST, OR ENSURE FOUR TIMES A DAY.  ADD REGLAN 30 MINS PRIOR TO EATING OR DRINKING TWICE DAILY. SIDE EFFECTS OF REGLAN INCLUDE  AGITATION, DRAINAGE FROM BREAST, INVOLUNTARY MOVEMENT OF HEAD, NECK AND TORSO, OR CHANGES IN VISION.  STOP IBUPROFEN. USE TYLENOL.  CONTINUE PROTONIX. TAKE 30 MINUTES PRIOR TO BREAKFAST.   FOLLOW UP IN 4 MOS.   UPPER ENDOSCOPY AFTER CARE Read the instructions outlined below and refer to this sheet in the next week. These discharge instructions provide you with general information on caring for yourself after you leave the hospital. While your treatment has been planned according to the most current medical practices available, unavoidable complications occasionally occur. If you have any problems or questions after discharge, call DR. Jodel Mayhall, [phone].  ACTIVITY  You may resume your regular activity, but move at a slower pace for the next 24 hours.   Take frequent rest periods for the next 24 hours.   Walking will help get rid of the air and reduce the bloated feeling in your belly (abdomen).   No driving for 24 hours (because of the medicine (anesthesia) used during the test).   You may shower.   Do not sign any important legal documents or operate any machinery for 24 hours (because of the anesthesia used during the test).    NUTRITION  Drink plenty of fluids.   You may resume your normal diet as instructed by your doctor.   Begin with a light meal and progress to your normal diet. Heavy or fried foods are harder to digest and  may make you feel sick to your stomach (nauseated).   Avoid alcoholic beverages for 24 hours or as instructed.    MEDICATIONS  You may resume your normal medications.   WHAT YOU CAN EXPECT TODAY  Some feelings of bloating in the abdomen.   Passage of more gas than usual.    IF YOU HAD A BIOPSY TAKEN DURING THE UPPER ENDOSCOPY:  Eat a soft diet IF YOU HAVE NAUSEA, BLOATING, ABDOMINAL PAIN, OR VOMITING.    FINDING OUT THE RESULTS OF YOUR TEST Not all test results are available during your visit. DR. Oneida Alar WILL CALL YOU WITHIN 7 DAYS OF YOUR PROCEDUE WITH YOUR RESULTS. Do not assume everything is normal if you have not heard from DR. Cong Hightower IN ONE WEEK, CALL HER OFFICE AT [phone].  SEEK IMMEDIATE MEDICAL ATTENTION AND CALL THE OFFICE: [phone] IF:  You have more than a spotting of blood in your stool.   Your belly is swollen (abdominal distention).   You are nauseated or vomiting.   You have a temperature over 101F.   You have abdominal pain or discomfort that is severe or gets worse throughout the day.

## 2018-09-26 NOTE — Telephone Encounter (Addendum)
PA submitted via [employer] website for CT head. PA pending. Clinicals faxed. Case# [id number]  Called endo in short stay and spoke with Altha Harm. She is aware EGD scheduled for 9/17. EGD instructions in chart. Pre-op scheduled for 914 at 2:15pm. Altha Harm will inform patient.

## 2018-09-26 NOTE — Telephone Encounter (Signed)
PT NEEDS EGD W/ MAC SEP 21 _0 , Dx: WEIGHT LOSS, VOMITING.

## 2018-09-26 NOTE — Addendum Note (Signed)
Addended by: Inge Rise on: 09/26/2018 11:50 AM   Modules accepted: Orders

## 2018-09-29 ENCOUNTER — Other Ambulatory Visit: Payer: Self-pay

## 2018-09-29 ENCOUNTER — Telehealth: Payer: Self-pay

## 2018-09-29 ENCOUNTER — Encounter (HOSPITAL_COMMUNITY)
Admission: RE | Admit: 2018-09-29 | Discharge: 2018-09-29 | Disposition: A | Discharge status: Home / Self Care | Payer: Medicaid Other | Source: Ambulatory Visit | Attending: Gastroenterology | Admitting: Gastroenterology

## 2018-09-29 DIAGNOSIS — Z01812 Encounter for preprocedural laboratory examination: Secondary | ICD-10-CM | POA: Insufficient documentation

## 2018-09-29 LAB — BASIC METABOLIC PANEL
Anion gap: 10 (ref 5–15)
BUN: 9 mg/dL (ref 6–20)
CO2: 20 mmol/L — ABNORMAL LOW (ref 22–32)
Calcium: 9.1 mg/dL (ref 8.9–10.3)
Chloride: 106 mmol/L (ref 98–111)
Creatinine, Ser: 0.48 mg/dL (ref 0.44–1.00)
GFR calc Af Amer: >60 mL/min (ref >60)
GFR calc non Af Amer: >60 mL/min (ref >60)
Glucose, Bld: 98 mg/dL (ref 70–99)
Potassium: 3.8 mmol/L (ref 3.5–5.1)
Sodium: 136 mmol/L (ref 135–145)

## 2018-09-29 LAB — HCG, SERUM, QUALITATIVE: Preg, Serum: NEGATIVE

## 2018-09-29 NOTE — Patient Instructions (Signed)
20    Your procedure is scheduled on: 10/02/2018  Report to [facility] at     6:15 AM.  Call this number if you have problems the morning of surgery: [phone]   Remember:   Do not drink or eat food:After Midnight.      Take these medicines the morning of surgery with A SIP OF WATER: Protonix, claritin, and phenergan if needed   Do not wear jewelry, make-up or nail polish.  Do not wear lotions, powders, or perfumes. You may wear deodorant.                Do not bring valuables to the hospital.  Contacts, dentures or bridgework may not be worn into surgery.  Leave suitcase in the car. After surgery it may be brought to your room.  For patients admitted to the hospital, checkout time is 11:00 AM the day of discharge.   Patients discharged the day of surgery will not be allowed to drive home.                                                                                                                                    Endoscopy Care After Please read the instructions outlined below and refer to this sheet in the next few weeks. These discharge instructions provide you with general information on caring for yourself after you leave the hospital. Your doctor may also give you specific instructions. While your treatment has been planned according to the most current medical practices available, unavoidable complications occasionally occur. If you have any problems or questions after discharge, please call your doctor. HOME CARE INSTRUCTIONS Activity  You may resume your regular activity but move at a slower pace for the next 24 hours.   Take frequent rest periods for the next 24 hours.   Walking will help expel (get rid of) the air and reduce the bloated feeling in your abdomen.   No driving for 24 hours (because of the anesthesia (medicine) used during the test).   You may shower.   Do not sign any important legal documents or operate any machinery for 24 hours (because of the  anesthesia used during the test).  Nutrition  Drink plenty of fluids.   You may resume your normal diet.   Begin with a light meal and progress to your normal diet.   Avoid alcoholic beverages for 24 hours or as instructed by your caregiver.  Medications You may resume your normal medications unless your caregiver tells you otherwise. What you can expect today  You may experience abdominal discomfort such as a feeling of fullness or "gas" pains.   You may experience a sore throat for 2 to 3 days. This is normal. Gargling with salt water may help this.  Follow-up Your doctor will discuss the results of your test with you. SEEK IMMEDIATE MEDICAL CARE IF:  You have excessive nausea (feeling sick to  your stomach) and/or vomiting.   You have severe abdominal pain and distention (swelling).   You have trouble swallowing.   You have a temperature over 100 F (37.8 C).   You have rectal bleeding or vomiting of blood.  Document Released: 08/16/2003 Document Revised: 12/21/2010 Document Reviewed: 02/26/2007  Esophageal Dilatation Esophageal dilatation, also called esophageal dilation, is a procedure to widen or open (dilate) a blocked or narrowed part of the esophagus. The esophagus is the part of the body that moves food and liquid from the mouth to the stomach. You may need this procedure if:  You have a buildup of scar tissue in your esophagus that makes it difficult, painful, or impossible to swallow. This can be caused by gastroesophageal reflux disease (GERD).  You have cancer of the esophagus.  There is a problem with how food moves through your esophagus. In some cases, you may need this procedure repeated at a later time to dilate the esophagus gradually. Tell a health care provider about:  Any allergies you have.  All medicines you are taking, including vitamins, herbs, eye drops, creams, and over-the-counter medicines.  Any problems you or family members have had with  anesthetic medicines.  Any blood disorders you have.  Any surgeries you have had.  Any medical conditions you have.  Any antibiotic medicines you are required to take before dental procedures.  Whether you are pregnant or may be pregnant. What are the risks? Generally, this is a safe procedure. However, problems may occur, including:  Bleeding due to a tear in the lining of the esophagus.  A hole (perforation) in the esophagus. What happens before the procedure?  Follow instructions from your health care provider about eating or drinking restrictions.  Ask your health care provider about changing or stopping your regular medicines. This is especially important if you are taking diabetes medicines or blood thinners.  Plan to have someone take you home from the hospital or clinic.  Plan to have a responsible adult care for you for at least 24 hours after you leave the hospital or clinic. This is important. What happens during the procedure?  You may be given a medicine to help you relax (sedative).  A numbing medicine may be sprayed into the back of your throat, or you may gargle the medicine.  Your health care provider may perform the dilatation using various surgical instruments, such as: ? Simple dilators. This instrument is carefully placed in the esophagus to stretch it. ? Guided wire bougies. This involves using an endoscope to insert a wire into the esophagus. A dilator is passed over this wire to enlarge the esophagus. Then the wire is removed. ? Balloon dilators. An endoscope with a small balloon at the end is inserted into the esophagus. The balloon is inflated to stretch the esophagus and open it up. The procedure may vary among health care providers and hospitals. What happens after the procedure?  Your blood pressure, heart rate, breathing rate, and blood oxygen level will be monitored until the medicines you were given have worn off.  Your throat may feel slightly  sore and numb. This will improve slowly over time.  You will not be allowed to eat or drink until your throat is no longer numb.  When you are able to drink, urinate, and sit on the edge of the bed without nausea or dizziness, you may be able to return home. Follow these instructions at home:  Take over-the-counter and prescription medicines only as told  by your health care provider.  Do not drive for 24 hours if you were given a sedative during your procedure.  You should have a responsible adult with you for 24 hours after the procedure.  Follow instructions from your health care provider about any eating or drinking restrictions.  Do not use any products that contain nicotine or tobacco, such as cigarettes and e-cigarettes. If you need help quitting, ask your health care provider.  Keep all follow-up visits as told by your health care provider. This is important. Get help right away if you:  Have a fever.  Have chest pain.  Have pain that is not relieved by medication.  Have trouble breathing.  Have trouble swallowing.  Vomit blood. Summary  Esophageal dilatation, also called esophageal dilation, is a procedure to widen or open (dilate) a blocked or narrowed part of the esophagus.  Plan to have someone take you home from the hospital or clinic.  For this procedure, a numbing medicine may be sprayed into the back of your throat, or you may gargle the medicine.  Do not drive for 24 hours if you were given a sedative during your procedure. This information is not intended to replace advice given to you by your health care provider. Make sure you discuss any questions you have with your health care provider. Document Released: 02/22/2005 Document Revised: 12/14/2016 Document Reviewed: 11/06/2016 Elsevier Patient Education  2020 [employer].

## 2018-09-29 NOTE — Telephone Encounter (Signed)
Checked [employer] and PA still pending

## 2018-09-29 NOTE — Telephone Encounter (Signed)
Pt called to find out when she will do CT of the head. Per Mindy, she is waiting on approval and will let her know. Pt is aware.

## 2018-09-30 ENCOUNTER — Other Ambulatory Visit: Payer: Self-pay

## 2018-09-30 ENCOUNTER — Other Ambulatory Visit (HOSPITAL_COMMUNITY)
Admission: RE | Admit: 2018-09-30 | Discharge: 2018-09-30 | Disposition: A | Discharge status: Home / Self Care | Payer: Medicaid Other | Source: Ambulatory Visit | Attending: Gastroenterology | Admitting: Gastroenterology

## 2018-09-30 ENCOUNTER — Encounter (HOSPITAL_COMMUNITY): Payer: Self-pay | Admitting: Gastroenterology

## 2018-09-30 DIAGNOSIS — Z01812 Encounter for preprocedural laboratory examination: Secondary | ICD-10-CM | POA: Insufficient documentation

## 2018-09-30 DIAGNOSIS — Z20828 Contact with and (suspected) exposure to other viral communicable diseases: Secondary | ICD-10-CM | POA: Insufficient documentation

## 2018-09-30 LAB — SARS CORONAVIRUS 2 (TAT 6-24 HRS): SARS Coronavirus 2: NEGATIVE

## 2018-09-30 NOTE — Telephone Encounter (Signed)
Checked [employer] and PA is still pending

## 2018-09-30 NOTE — Telephone Encounter (Signed)
PA approved. Auth# [id number] dates 09/26/2018-03/25/19

## 2018-09-30 NOTE — Telephone Encounter (Signed)
CT head scheduled for 9/16 at 7:00pm, arrival time 6:45pm.   Called patient and made aware. Nothing further needed

## 2018-10-01 ENCOUNTER — Other Ambulatory Visit: Payer: Self-pay

## 2018-10-01 ENCOUNTER — Ambulatory Visit (HOSPITAL_COMMUNITY)
Admission: RE | Admit: 2018-10-01 | Discharge: 2018-10-01 | Disposition: A | Discharge status: Home / Self Care | Payer: Medicaid Other | Source: Ambulatory Visit | Attending: Gastroenterology | Admitting: Gastroenterology

## 2018-10-01 DIAGNOSIS — R51 Headache: Secondary | ICD-10-CM | POA: Insufficient documentation

## 2018-10-01 DIAGNOSIS — R519 Headache, unspecified: Secondary | ICD-10-CM

## 2018-10-01 DIAGNOSIS — R634 Abnormal weight loss: Secondary | ICD-10-CM | POA: Diagnosis not present

## 2018-10-01 DIAGNOSIS — R112 Nausea with vomiting, unspecified: Secondary | ICD-10-CM

## 2018-10-02 ENCOUNTER — Ambulatory Visit (HOSPITAL_COMMUNITY): Payer: Medicaid Other | Admitting: Anesthesiology

## 2018-10-02 ENCOUNTER — Encounter (HOSPITAL_COMMUNITY): Payer: Self-pay | Admitting: *Deleted

## 2018-10-02 ENCOUNTER — Telehealth: Payer: Self-pay

## 2018-10-02 ENCOUNTER — Ambulatory Visit (HOSPITAL_COMMUNITY)
Admission: RE | Admit: 2018-10-02 | Discharge: 2018-10-02 | Disposition: A | Discharge status: Home / Self Care | Payer: Medicaid Other | Attending: Gastroenterology | Admitting: Gastroenterology

## 2018-10-02 ENCOUNTER — Encounter (HOSPITAL_COMMUNITY)
Admission: RE | Disposition: A | Discharge status: Home / Self Care | Payer: Self-pay | Source: Home / Self Care | Attending: Gastroenterology

## 2018-10-02 DIAGNOSIS — F17210 Nicotine dependence, cigarettes, uncomplicated: Secondary | ICD-10-CM | POA: Diagnosis not present

## 2018-10-02 DIAGNOSIS — J45990 Exercise induced bronchospasm: Secondary | ICD-10-CM | POA: Diagnosis not present

## 2018-10-02 DIAGNOSIS — Z79899 Other long term (current) drug therapy: Secondary | ICD-10-CM | POA: Insufficient documentation

## 2018-10-02 DIAGNOSIS — R1310 Dysphagia, unspecified: Secondary | ICD-10-CM | POA: Diagnosis not present

## 2018-10-02 DIAGNOSIS — K2950 Unspecified chronic gastritis without bleeding: Secondary | ICD-10-CM | POA: Diagnosis not present

## 2018-10-02 DIAGNOSIS — R634 Abnormal weight loss: Secondary | ICD-10-CM

## 2018-10-02 DIAGNOSIS — R1110 Vomiting, unspecified: Secondary | ICD-10-CM | POA: Diagnosis not present

## 2018-10-02 DIAGNOSIS — R112 Nausea with vomiting, unspecified: Secondary | ICD-10-CM | POA: Insufficient documentation

## 2018-10-02 HISTORY — PX: ESOPHAGOGASTRODUODENOSCOPY (EGD) WITH PROPOFOL: SHX5813

## 2018-10-02 HISTORY — PX: BIOPSY: SHX5522

## 2018-10-02 HISTORY — PX: SAVORY DILATION: SHX5439

## 2018-10-02 LAB — CORTISOL-AM, BLOOD: Cortisol - AM: 9.5 ug/dL (ref 6.7–22.6)

## 2018-10-02 LAB — TSH: TSH: 1.054 u[IU]/mL (ref 0.350–4.500)

## 2018-10-02 SURGERY — ESOPHAGOGASTRODUODENOSCOPY (EGD) WITH PROPOFOL
Anesthesia: General

## 2018-10-02 MED ORDER — PROPOFOL 10 MG/ML IV BOLUS
INTRAVENOUS | Status: DC | PRN
  Administered 2018-10-02 (×2): 40 mg via INTRAVENOUS
  Administered 2018-10-02 (×2): 20 mg via INTRAVENOUS
  Administered 2018-10-02: 40 mg via INTRAVENOUS

## 2018-10-02 MED ORDER — PROPOFOL 500 MG/50ML IV EMUL
INTRAVENOUS | Status: DC | PRN
  Administered 2018-10-02 (×2): 150 ug/kg/min via INTRAVENOUS

## 2018-10-02 MED ORDER — CHLORHEXIDINE GLUCONATE CLOTH 2 % EX PADS: 6.0000 | MEDICATED_PAD | Freq: Once | CUTANEOUS | Status: DC

## 2018-10-02 MED ORDER — LIDOCAINE VISCOUS HCL 2 % MT SOLN
OROMUCOSAL | Status: DC | PRN
  Administered 2018-10-02: 1 via OROMUCOSAL

## 2018-10-02 MED ORDER — LACTATED RINGERS IV SOLN
INTRAVENOUS | Status: DC | PRN
  Administered 2018-10-02: 10:00:00 via INTRAVENOUS

## 2018-10-02 MED ORDER — LIDOCAINE VISCOUS HCL 2 % MT SOLN
OROMUCOSAL | Status: AC
  Filled 2018-10-02: qty 30

## 2018-10-02 MED ORDER — LACTATED RINGERS IV SOLN
Freq: Once | INTRAVENOUS | Status: AC
  Administered 2018-10-02: 08:00:00 via INTRAVENOUS

## 2018-10-02 MED ORDER — METOCLOPRAMIDE HCL 5 MG PO TABS: ORAL_TABLET | ORAL | 11 refills | Status: DC

## 2018-10-02 MED ORDER — ONDANSETRON HCL 4 MG/2ML IJ SOLN: 4.0000 mg | Freq: Once | INTRAMUSCULAR | Status: DC | PRN

## 2018-10-02 MED ORDER — MINERAL OIL LIGHT 100 % EX OIL
TOPICAL_OIL | CUTANEOUS | Status: DC | PRN
  Administered 2018-10-02 (×2): 1 via TOPICAL

## 2018-10-02 MED ORDER — MINERAL OIL PO OIL
TOPICAL_OIL | ORAL | Status: AC
  Filled 2018-10-02: qty 60

## 2018-10-02 MED ORDER — LIDOCAINE VISCOUS HCL 2 % MT SOLN: 5.0000 mL | Freq: Once | OROMUCOSAL | Status: DC

## 2018-10-02 MED ORDER — PROPOFOL 10 MG/ML IV BOLUS
INTRAVENOUS | Status: AC
  Filled 2018-10-02: qty 20

## 2018-10-02 NOTE — Anesthesia Postprocedure Evaluation (Signed)
Anesthesia Post Note  Patient: Amber Marsh  Procedure(s) Performed: ESOPHAGOGASTRODUODENOSCOPY (EGD) WITH PROPOFOL (N/A ) BIOPSY  Patient location during evaluation: PACU Anesthesia Type: General Level of consciousness: awake, awake and alert and patient cooperative Pain management: satisfactory to patient Vital Signs Assessment: post-procedure vital signs reviewed and stable Respiratory status: spontaneous breathing Cardiovascular status: stable Postop Assessment: no apparent nausea or vomiting Anesthetic complications: no     Last Vitals:  Vitals:   10/02/18 1005 10/02/18 1015  BP: (!) 90/51 101/63  Pulse: 83 (!) 59  Resp: 16 16  Temp: 36.6 C   SpO2: 99% 99%    Last Pain:  Vitals:   10/02/18 1005  TempSrc:   PainSc: Asleep                 Zyona Pettaway

## 2018-10-02 NOTE — Discharge Instructions (Signed)
I dilated your esophagus DUE TO YOUR PROBLEM WITH NAUSEA AND VOMTIING. I SAW NO OBVIOUS INFLAMMATION IN YOUR STOMACH. I biopsied your ESOPHAGUS, stomach, AND SMALL BOWEL. I AM CHECKING YOU FOR THYROID DISEASE, LOW CORTISOL LEVEL, AND DIABETES TODAY.    DRINK WATER TO KEEP YOUR URINE LIGHT YELLOW.  INCREASE REGLAN(METOCLOPRAMIDE) DOSE. TAKE REGLAN AT 8 AM 12N, 4P, AND 8 PM. TRY TO EAT 30 MINS AFTER EACH DOSE. AVOID fatty or fried foods, spicy food, tomato sauce, carbonated beverages, alcohol, chocolate, mint, garlic, onion, caffeine and nicotine.    YOUR BIOPSY RESULTS WILL BE BACK IN 5 BUSINESS DAYS.  FOLLOW UP IN 3 MOS.  UPPER ENDOSCOPY AFTER CARE Read the instructions outlined below and refer to this sheet in the next week. These discharge instructions provide you with general information on caring for yourself after you leave the hospital. While your treatment has been planned according to the most current medical practices available, unavoidable complications occasionally occur. If you have any problems or questions after discharge, call DR. Jameika Kinn, [phone].  ACTIVITY  You may resume your regular activity, but move at a slower pace for the next 24 hours.   Take frequent rest periods for the next 24 hours.   Walking will help get rid of the air and reduce the bloated feeling in your belly (abdomen).   No driving for 24 hours (because of the medicine (anesthesia) used during the test).   You may shower.   Do not sign any important legal documents or operate any machinery for 24 hours (because of the anesthesia used during the test).    NUTRITION  Drink plenty of fluids.   You may resume your normal diet as instructed by your doctor.   Begin with a light meal and progress to your normal diet. Heavy or fried foods are harder to digest and may make you feel sick to your stomach (nauseated).   Avoid alcoholic beverages for 24 hours or as instructed.    MEDICATIONS  You  may resume your normal medications.   WHAT YOU CAN EXPECT TODAY  Some feelings of bloating in the abdomen.   Passage of more gas than usual.    IF YOU HAD A BIOPSY TAKEN DURING THE UPPER ENDOSCOPY:  Eat a soft diet IF YOU HAVE NAUSEA, BLOATING, ABDOMINAL PAIN, OR VOMITING.    FINDING OUT THE RESULTS OF YOUR TEST Not all test results are available during your visit. DR. Darrick PennaFIELDS WILL CALL YOU WITHIN 14 DAYS OF YOUR PROCEDUE WITH YOUR RESULTS. Do not assume everything is normal if you have not heard from DR. Zerek Litsey, CALL HER OFFICE AT [phone].  SEEK IMMEDIATE MEDICAL ATTENTION AND CALL THE OFFICE: [phone] IF:  You have more than a spotting of blood in your stool.   Your belly is swollen (abdominal distention).   You are nauseated or vomiting.   You have a temperature over 101F.   You have abdominal pain or discomfort that is severe or gets worse throughout the day.  Gastritis  Gastritis is an inflammation (the body's way of reacting to injury and/or infection) of the stomach. It is often caused by viral or bacterial (germ) infections. It can also be caused BY ASPIRIN, BC/GOODY POWDER'S, (IBUPROFEN) MOTRIN, OR ALEVE (NAPROXEN), chemicals (including alcohol), SPICY FOODS, and medications. This illness may be associated with generalized malaise (feeling tired, not well), UPPER ABDOMINAL STOMACH cramps, and fever. One common bacterial cause of gastritis is an organism known as H. Pylori. This can be treated  with antibiotics.   ESOPHAGEAL STRICTURE  Esophageal strictures can be caused by stomach acid backing up into the tube that carries food from the mouth down to the stomach (lower esophagus).  TREATMENT There are a number of medicines used to treat reflux/stricture, including: Antacids.  Proton-pump inhibitors: OMEPRAZOLE OR NEXIUM  HOME CARE INSTRUCTIONS Eat 2-3 hours before going to bed.  Try to reach and maintain a healthy weight.  Do not eat just a few very large  meals. Instead, eat 4 TO 6 smaller meals throughout the day.  Try to identify foods and beverages that make your symptoms worse, and avoid these.  Avoid tight clothing.  Do not exercise right after eating.          Eliminate heartburn triggers. Everyone has specific triggers. Common triggers such as fatty or fried foods, spicy food, tomato sauce, carbonated beverages, alcohol, chocolate, mint, garlic, onion, caffeine and nicotine may make heartburn worse.    Avoid stooping or bending. Tying your shoes is OK. Bending over for longer periods to weed your garden isn't, especially soon after eating.    Don't lie down after a meal. Wait at least three to four hours after eating before going to bed, and don't lie down right after eating.    Alternative medicine  Several home remedies exist for treating GERD, but they provide only temporary relief. They include drinking baking soda (sodium bicarbonate) added to water or drinking other fluids such as baking soda mixed with cream of tartar and water.   Although these liquids create temporary relief by neutralizing, washing away or buffering acids, eventually they aggravate the situation by adding gas and fluid to your stomach, increasing pressure and causing more acid reflux. Further, adding more sodium to your diet may increase your blood pressure and add stress to your heart, and excessive bicarbonate ingestion can alter the acid-base balance in your body.

## 2018-10-02 NOTE — Op Note (Addendum)
[facility] Patient Name: Amber Marsh Procedure Date: 10/02/2018 9:25 AM MRN: [id number] Date of Birth: Sep 13, 1995 Attending MD: Barney Drain MD, MD CSN: [id number] Age: 23 Admit Type: Outpatient Procedure:                Upper GI endoscopy WITH COLD FORCEPS                            BIOPSY/ESOPHAGEAL DILATION Indications:              Nausea with vomiting. ABLE TO TOLERATE WATER, SUCK                            ON POTATO CHIPS, POPSICLES, AND ENSURE. SEP 2020:                            CT OF HEAD: NL. DUODENAL BIOPSIES: NL Providers:                Barney Drain MD, MD, Lurline Del, RN, Nelma Rothman,                            Technician Referring MD:             Koleen Distance. Gottschalk Medicines:                Propofol per Anesthesia Complications:            No immediate complications. Estimated Blood Loss:     Estimated blood loss was minimal. Procedure:                Pre-Anesthesia Assessment:                           - Prior to the procedure, a History and Physical                            was performed, and patient medications and                            allergies were reviewed. The patient's tolerance of                            previous anesthesia was also reviewed. The risks                            and benefits of the procedure and the sedation                            options and risks were discussed with the patient.                            All questions were answered, and informed consent                            was obtained. Prior Anticoagulants: The patient has  taken no previous anticoagulant or antiplatelet                            agents. ASA Grade Assessment: II - A patient with                            mild systemic disease. After reviewing the risks                            and benefits, the patient was deemed in                            satisfactory condition to undergo the procedure.          After obtaining informed consent, the endoscope was                            passed under direct vision. Throughout the                            procedure, the patient's blood pressure, pulse, and                            oxygen saturations were monitored continuously. The                            [id number]-H190 ([id number]) scope was introduced through the                            mouth, and advanced to the second part of duodenum.                            The upper GI endoscopy was accomplished without                            difficulty. The patient tolerated the procedure                            well. Scope In: 9:43:57 AM Scope Out: 9:53:18 AM Total Procedure Duration: 0 hours 9 minutes 21 seconds  Findings:      No endoscopic abnormality was evident in the esophagus to explain the       patient's complaint of dysphagia. It was decided, however, to proceed       with dilation of the entire esophagus DUE TO POSSIBLE PROXIMAL       ESOPHAGEAL WEB. A guidewire was placed and the scope was withdrawn.       Dilation was performed with a Savary dilator with no resistance at 17       mm. Biopsies were obtained from the proximal(~15 cm FROM THE INCISORS)       and distal esophagus(~30 CM FROM THE INCISORS) with cold forceps for       histology of suspected eosinophilic esophagitis. EGJ ~35 CM FROM THE       INCISORS.      The entire examined stomach was normal. Biopsies were taken with a cold  forceps for histology.      The examined duodenum was normal. Impression:               - No SOURCE FOR NAUSEA & VOMITING IDENTIFIED Moderate Sedation:      Per Anesthesia Care Recommendation:           - Patient has a contact number available for                            emergencies. The signs and symptoms of potential                            delayed complications were discussed with the                            patient. Return to normal activities tomorrow.                             Written discharge instructions were provided to the                            patient.                           - Resume previous diet.                           - Continue present medications. INCREASE REGLAN.                            TAKE DOSE AT 8 AM, 12N, 4P, & 8P. TRY TO EAT 30                            MINS AFTER EACH DOSE.                           - Await pathology results. CHECK TSH, CORTISOL,                            HGA1C TODAY.                           - Return to my office in 3 months. Procedure Code(s):        --- Professional ---                           [phone] [id number], Esophagogastroduodenoscopy, flexible,                            transoral; with insertion of guide wire followed by                            passage of dilator(s) through esophagus over guide                            wire  [id number], 59, Esophagogastroduodenoscopy, flexible,                            transoral; with biopsy, single or multiple Diagnosis Code(s):        --- Professional ---                           R13.10, Dysphagia, unspecified                           R11.2, Nausea with vomiting, unspecified CPT copyright 2019 American Medical Association. All rights reserved. The codes documented in this report are preliminary and upon coder review may  be revised to meet current compliance requirements. Jonette EvaSandi Ranson Belluomini, MD Jonette EvaSandi Hilja Kintzel MD, MD 10/02/2018 10:14:11 AM This report has been signed electronically. Number of Addenda: 0

## 2018-10-02 NOTE — Anesthesia Procedure Notes (Signed)
Procedure Name: MAC Date/Time: 10/02/2018 9:35 AM Performed by: Vista Deck, CRNA Pre-anesthesia Checklist: Patient identified, Emergency Drugs available, Suction available, Timeout performed and Patient being monitored Patient Re-evaluated:Patient Re-evaluated prior to induction Oxygen Delivery Method: Non-rebreather mask

## 2018-10-02 NOTE — Telephone Encounter (Signed)
Received a call from [place] at endo. Please schedule 3 month f/u with [facility]  When schedule is available.

## 2018-10-02 NOTE — Anesthesia Preprocedure Evaluation (Addendum)
Anesthesia Evaluation  Patient identified by MRN, date of birth, ID band Patient awake    Reviewed: Allergy & Precautions, NPO status , Patient's Chart, lab work & pertinent test results  Airway Mallampati: III  TM Distance: >3 FB Neck ROM: Full    Dental no notable dental hx.    Pulmonary asthma , Current Smoker,    Pulmonary exam normal breath sounds clear to auscultation       Cardiovascular Exercise Tolerance: Good  Rhythm:Regular Rate:Normal  EKG - SR, non specific T wave abnormality   Neuro/Psych  Headaches, negative psych ROS   GI/Hepatic Neg liver ROS, Nausea, vomiting, right sided abdominal pain   Endo/Other  negative endocrine ROS  Renal/GU negative Renal ROS  negative genitourinary   Musculoskeletal negative musculoskeletal ROS (+) Right knee pain   Abdominal   Peds negative pediatric ROS (+)  Hematology   Anesthesia Other Findings   Reproductive/Obstetrics                            Anesthesia Physical Anesthesia Plan  ASA: II  Anesthesia Plan: General   Post-op Pain Management:    Induction: Intravenous  PONV Risk Score and Plan:   Airway Management Planned: Nasal Cannula and Natural Airway  Additional Equipment:   Intra-op Plan:   Post-operative Plan:   Informed Consent: I have reviewed the patients History and Physical, chart, labs and discussed the procedure including the risks, benefits and alternatives for the proposed anesthesia with the patient or authorized representative who has indicated his/her understanding and acceptance.     Dental advisory given  Plan Discussed with: CRNA  Anesthesia Plan Comments:         Anesthesia Quick Evaluation

## 2018-10-02 NOTE — Transfer of Care (Signed)
Immediate Anesthesia Transfer of Care Note  Patient: Amber Marsh  Procedure(s) Performed: ESOPHAGOGASTRODUODENOSCOPY (EGD) WITH PROPOFOL (N/A ) BIOPSY  Patient Location: PACU  Anesthesia Type:General  Level of Consciousness: awake and patient cooperative  Airway & Oxygen Therapy: Patient Spontanous Breathing  Post-op Assessment: Report given to RN and Post -op Vital signs reviewed and stable  Post vital signs: Reviewed and stable  Last Vitals:  Vitals Value Taken Time  BP 90/50 1006  Temp 97.9 1006  Pulse 83 10/02/18 1005  Resp 16 10/02/18 1005  SpO2 99 % 10/02/18 1005  Vitals shown include unvalidated device data.  Last Pain:  Vitals:   10/02/18 0758  TempSrc: Oral  PainSc: 6       Patients Stated Pain Goal: 7 (33/82/50 5397)  Complications: No apparent anesthesia complications

## 2018-10-02 NOTE — Interval H&P Note (Signed)
History and Physical Interval Note:  10/02/2018 9:21 AM  Amber Marsh  has presented today for surgery, with the diagnosis of weight loss, vomiting.  The various methods of treatment have been discussed with the patient and family. After consideration of risks, benefits and other options for treatment, the patient has consented to  Procedure(s) with comments: ESOPHAGOGASTRODUODENOSCOPY (EGD) WITH PROPOFOL (N/A) - 7:30am as a surgical intervention.  The patient's history has been reviewed, patient examined, no change in status, stable for surgery.  I have reviewed the patient's chart and labs.  Questions were answered to the patient's satisfaction.     [employer]

## 2018-10-03 ENCOUNTER — Other Ambulatory Visit: Payer: Self-pay | Admitting: Gastroenterology

## 2018-10-03 LAB — HEMOGLOBIN A1C
Hgb A1c MFr Bld: 4.9 % (ref 4.8–5.6)
Mean Plasma Glucose: 94 mg/dL

## 2018-10-03 LAB — SURGICAL PATHOLOGY

## 2018-10-05 NOTE — Telephone Encounter (Signed)
Reminder in epic °

## 2018-10-06 ENCOUNTER — Other Ambulatory Visit: Payer: Self-pay | Admitting: *Deleted

## 2018-10-06 ENCOUNTER — Encounter (HOSPITAL_COMMUNITY): Payer: Self-pay | Admitting: Gastroenterology

## 2018-10-06 DIAGNOSIS — R112 Nausea with vomiting, unspecified: Secondary | ICD-10-CM

## 2018-10-17 ENCOUNTER — Telehealth: Payer: Self-pay | Admitting: Gastroenterology

## 2018-10-17 NOTE — Telephone Encounter (Signed)
Called patient. She has been given # to [facility]

## 2018-10-17 NOTE — Telephone Encounter (Signed)
Pt had procedure done recently by [facility] and is aware of her 3 month follow up appt.. She said that we were suppose to be referring her to [facility] and hasn't heard from anyone. Please advise. [phone]

## 2018-11-18 ENCOUNTER — Other Ambulatory Visit: Payer: Self-pay | Admitting: *Deleted

## 2018-11-18 DIAGNOSIS — Z20828 Contact with and (suspected) exposure to other viral communicable diseases: Secondary | ICD-10-CM | POA: Diagnosis not present

## 2018-11-18 DIAGNOSIS — Z20822 Contact with and (suspected) exposure to covid-19: Secondary | ICD-10-CM

## 2018-11-19 LAB — NOVEL CORONAVIRUS, NAA: SARS-CoV-2, NAA: NOT DETECTED

## 2018-11-22 DIAGNOSIS — K5900 Constipation, unspecified: Secondary | ICD-10-CM | POA: Diagnosis not present

## 2018-11-22 DIAGNOSIS — R1084 Generalized abdominal pain: Secondary | ICD-10-CM | POA: Diagnosis not present

## 2018-11-22 DIAGNOSIS — Z20828 Contact with and (suspected) exposure to other viral communicable diseases: Secondary | ICD-10-CM | POA: Diagnosis not present

## 2018-11-22 DIAGNOSIS — J3489 Other specified disorders of nose and nasal sinuses: Secondary | ICD-10-CM | POA: Diagnosis not present

## 2018-11-22 DIAGNOSIS — R0981 Nasal congestion: Secondary | ICD-10-CM | POA: Diagnosis not present

## 2018-11-22 DIAGNOSIS — R7982 Elevated C-reactive protein (CRP): Secondary | ICD-10-CM | POA: Diagnosis not present

## 2018-11-22 DIAGNOSIS — R112 Nausea with vomiting, unspecified: Secondary | ICD-10-CM | POA: Diagnosis not present

## 2018-11-22 DIAGNOSIS — Z3202 Encounter for pregnancy test, result negative: Secondary | ICD-10-CM | POA: Diagnosis not present

## 2018-11-22 DIAGNOSIS — R700 Elevated erythrocyte sedimentation rate: Secondary | ICD-10-CM | POA: Diagnosis not present

## 2018-12-02 ENCOUNTER — Other Ambulatory Visit (HOSPITAL_COMMUNITY)
Admission: RE | Admit: 2018-12-02 | Discharge: 2018-12-02 | Disposition: A | Discharge status: Home / Self Care | Payer: Medicaid Other | Source: Ambulatory Visit | Attending: Adult Health | Admitting: Adult Health

## 2018-12-02 ENCOUNTER — Encounter: Payer: Self-pay | Admitting: Adult Health

## 2018-12-02 ENCOUNTER — Other Ambulatory Visit: Payer: Self-pay

## 2018-12-02 ENCOUNTER — Ambulatory Visit (INDEPENDENT_AMBULATORY_CARE_PROVIDER_SITE_OTHER): Payer: Medicaid Other | Admitting: Adult Health

## 2018-12-02 VITALS — BP 105/73 | HR 84 | Ht 65.0 in | Wt 156.0 lb

## 2018-12-02 DIAGNOSIS — R112 Nausea with vomiting, unspecified: Secondary | ICD-10-CM

## 2018-12-02 DIAGNOSIS — N926 Irregular menstruation, unspecified: Secondary | ICD-10-CM | POA: Insufficient documentation

## 2018-12-02 DIAGNOSIS — Z3202 Encounter for pregnancy test, result negative: Secondary | ICD-10-CM | POA: Diagnosis not present

## 2018-12-02 DIAGNOSIS — R102 Pelvic and perineal pain: Secondary | ICD-10-CM | POA: Insufficient documentation

## 2018-12-02 DIAGNOSIS — R1084 Generalized abdominal pain: Secondary | ICD-10-CM | POA: Diagnosis not present

## 2018-12-02 LAB — POCT URINE PREGNANCY: Preg Test, Ur: NEGATIVE

## 2018-12-02 NOTE — Progress Notes (Signed)
  Subjective:     Patient ID: Amber Marsh, female   DOB: [birth year], 23 y.o.   MRN: [id number]  HPI Amber Marsh is a 23 year old white female,single, G0P0 in complaining of abdominal pain for 7 months and vomiting with nausea for 7 months and irregular period this month on the patch.She has back pain at times too. She has had GI work up and is on meds but she says nothing has helped, knows she has hernia.  PCP is Dr Lajuana Ripple.   Review of Systems Pain in abdomen for 7 months Nausea and vomiting for 7 months, esp after eating, Reglan has not helped  Some back pain Irregular period this month on the patch No new partners  Reviewed past medical,surgical, social and family history. Reviewed medications and allergies.     Objective:   Physical Exam BP 105/73 (BP Location: Left Arm, Patient Position: Sitting, Cuff Size: Normal)   Pulse 84   Ht 5\' 5"  (1.651 m)   Wt 156 lb (70.8 kg)   LMP 11/30/2018   BMI 25.96 kg/m UPT is negative Skin warm and dry. Abdomen is soft and tender in all 4 quadrants. Pelvic: external genitalia is normal in appearance no lesions, vagina: +blood, CV swab obtained ,urethra has no lesions or masses noted, cervix:nulliparous, uterus: normal size, shape and contour, + tender, no masses felt, adnexa: no masses +tenderness noted, throughout. Bladder is  tender and no masses felt.    Fall risk is low Examination chaperoned by Levy Pupa LPN.  Assessment:     1. Generalized abdominal pain   2. Pregnancy examination or test, negative result   3. Pelvic pain   4. Non-intractable vomiting with nausea, unspecified vomiting type   5. Irregular bleeding       Plan:     CV swab sent Will get GYN [place] in about 2 weeks tp assess uterus and ovaries and talk when results back Follow up with GI  Follow up with PCP about disc bulge L 4-5 seen on CT in September

## 2018-12-03 LAB — CERVICOVAGINAL ANCILLARY ONLY
Bacterial Vaginitis (gardnerella): NEGATIVE
Candida Glabrata: NEGATIVE
Candida Vaginitis: NEGATIVE
Chlamydia: NEGATIVE
Comment: NEGATIVE
Comment: NEGATIVE
Comment: NEGATIVE
Comment: NEGATIVE
Comment: NEGATIVE
Comment: NORMAL
Neisseria Gonorrhea: NEGATIVE
Trichomonas: NEGATIVE

## 2018-12-17 ENCOUNTER — Ambulatory Visit (INDEPENDENT_AMBULATORY_CARE_PROVIDER_SITE_OTHER): Payer: Medicaid Other | Admitting: Gastroenterology

## 2018-12-17 ENCOUNTER — Encounter: Payer: Self-pay | Admitting: Gastroenterology

## 2018-12-17 ENCOUNTER — Other Ambulatory Visit: Payer: Self-pay

## 2018-12-17 DIAGNOSIS — R112 Nausea with vomiting, unspecified: Secondary | ICD-10-CM

## 2018-12-17 MED ORDER — METOCLOPRAMIDE HCL 10 MG PO TABS: ORAL_TABLET | ORAL | 11 refills | Status: DC

## 2018-12-17 MED ORDER — DICYCLOMINE HCL 10 MG PO CAPS: ORAL_CAPSULE | ORAL | 11 refills | Status: DC

## 2018-12-17 NOTE — Assessment & Plan Note (Signed)
SYMPTOMS NOT IDEALLY CONTROLLED AND ASSOCIATED WITH WEIGHT LOSS. PT MOST LIKLEY HAS GASTROPARESIS. NL CT HEAD, CORTISOL, HGA1C, AND TSH.  KEEP APPT WITH GI DOCS AT [facility]. DRINK WATER TO KEEP YOUR URINE LIGHT YELLOW. INCREASE ENSURE TO 5 CANS A DAY. TAKE REGLAN 30 MINS BEFORE MEALS AT 8 AM, 12N, 4 PM AND 8 PM. USE PROMETHAZINE AS NEEDED FOR NAUSEA OR VOMITING. FOLLOW A GASTROPARESIS DIET: STEP 1 FOR SEVERE NAUSEA AND VOMITING, STEP 2 FOR MODERATE NAUSEA OR VOMITING STEP 3 WITH MILD NAUSEA OR VOMITING. SEE HANDOUT. PLEASE CALL IN ONE MONTH IF SYMPTOMS ARE NOT IMPROVED.  FOLLOW UP IN 4 MOS.

## 2018-12-17 NOTE — Patient Instructions (Signed)
KEEP APPT WITH GI DOCS AT [facility].  DRINK WATER TO KEEP YOUR URINE LIGHT YELLOW.  INCREASE ENSURE TO 5 CANS A DAY.  TAKE REGLAN 30 MINS BEFORE MEALS AT 8 AM, 12N, 4 PM AND 8 PM.  USE PROMETHAZINE AS NEEDED FOR NAUSEA OR VOMITING.  FOLLOW A GASTROPARESIS DIET: STEP 1 FOR SEVERE NAUSEA AND VOMITING, STEP 2 FOR MODERATE NAUSEA OR VOMITING STEP 3 WITH MILD NAUSEA OR VOMITING. SEE HANDOUT.   PLEASE CALL IN ONE MONTH IF SYMPTOMS ARE NOT IMPROVED.   FOLLOW UP IN 4 MOS.    Gastroparesis  Gastroparesis is also called slowed stomach emptying (delayed gastric emptying). It is a condition in which the stomach takes too long to empty its contents.   CAUSES  Gastroparesis happens when nerves to the stomach are damaged or stop working. When the nerves are damaged, the muscles of the stomach and intestines do not work normally. The movement of food is slowed or stopped.   SYMPTOMS   Heartburn.   Feeling sick to your stomach (nausea).   Vomiting of undigested food.   An early feeling of fullness when eating.   Weight loss.   Abdominal bloating.   Erratic blood glucose levels.   Lack of appetite.   Gastroesophageal reflux.   Spasms of the stomach wall.    TREATMENT   Treatments include:   Exercise.   Medicines to control nausea and vomiting: ZOFRAN or PROMETHAZINE.   Medicines to stimulate stomach muscles: REGLAN.  Changes in what and when you eat. FOLLOW A GASTROPARESIS DIET.   EAT 4 TO 6 SMALL meals EVERY DAY.   Eating low-fiber forms of high-fiber foods, such as eating cooked vegetables instead of raw vegetables.   Eating low-fat foods.   Consuming liquids, which are easier to digest.    It is important to note that in most cases, treatment does not cure gastroparesis. It is usually a lasting (chronic) condition. Treatment helps you manage the condition so that you can be as healthy and comfortable as possible.

## 2018-12-17 NOTE — Progress Notes (Signed)
Subjective:    Patient ID: Amber Marsh, female    DOB: [birth year], 23 y.o.   MRN: [id number]  HPI STILL HAVING NAUSEA/VOMITING. THROWING UP 5-6 TIMES A DAY. WEIGHT DOWN FROM 180 LBS TO 148 LBS SINCE AUG 2020. NO BLOOD IN VOMIT. REGLAN NOT HELPING. PROMETHAZINE MAKES HER SLEEPY. ONCE OR TWICE A DAY. HEADACHES: A LITTLE BIT(1-2X/WEEK). NO CHANGE IN VISION, RINGING IN EARS, POSTERIOR NASAL DRIP, EAR PAIN, FACE PAIN, TOOTH PAIN, OR CHEEK PAIN.WENT TO [facility] ED AND DID BLOOD WORK AND NOTHING DONE. [facility] HAS AN APPT ON DEC 21. CAN'T KEEP CHICKEN BROTH DOWN.  HAS PROBLEMS KEEPING FOOD DOWN. TAKES 4 ENSURE A DAY. PROTONIX ONCE. KEEPS PILLS DOWN MOST OF THE TOWN. BMs: NO BM FOR 6 MOS. PASSING GAS. NO ANXIETY OR DEPRESSION.   PT DENIES FEVER, CHILLS, HEMATOCHEZIA, HEMATEMESIS, melena, diarrhea, CHEST PAIN, SHORTNESS OF BREATH, CHANGE IN BOWEL IN HABITS, constipation, abdominal pain, problems swallowing, problems with sedation, OR heartburn or indigestion.   Current Outpatient Medications  Medication Sig    . albuterol (PROVENTIL HFA;VENTOLIN HFA) 108 (90 Base) MCG/ACT inhaler Inhale 1-2 puffs into the lungs every 4 (four) hours as needed for wheezing or shortness of breath.    . Ensure (ENSURE) Take 237 mLs by mouth. One can at 8:00 am, 12 noon, 4:00 pm and 8:00 pm    . linaclotide (LINZESS) 290 MCG CAPS capsule Take 1 capsule (290 mcg total) by mouth daily.    Marland Kitchen loratadine (CLARITIN) 10 MG tablet Take 1 tablet (10 mg total) by mouth daily.    . metoCLOPramide (REGLAN) 5 MG tablet 1 po at 8 am, 12n, 4 pm, and 8 pm. TRY TO EAT 30 MINS AFTER EACH DOSE.    . norelgestromin-ethinyl estradiol [facility]) 150-35 MCG/24HR transdermal patch APPLY 1 PATCH ONCE A WEEK (Patient taking differently: Place 1 patch onto the skin See admin instructions. Apply 1 patch transdermally once a week for 3 weeks, off on 4th week)    . pantoprazole (PROTONIX) 40 MG tablet Take 1 tablet (40 mg total) by mouth daily before  breakfast. Take 30 minutes before breakfast    . promethazine (PHENERGAN) 12.5 MG tablet Take 1 tablet (12.5 mg total) by mouth every 6 (six) hours as needed for nausea or vomiting.    . SUMAtriptan (IMITREX) 50 MG tablet TAKE 1 TABLET BY MOUTH EVERY 2 HRS AS NEEDED FOR MIGRAINE. MAY REPEAT 1X IN 2 HOURS IF HEADACHE PERSISTS OR RECURS.    Marland Kitchen       Review of Systems PER HPI OTHERWISE ALL SYSTEMS ARE NEGATIVE.    Objective:   Physical Exam Constitutional:      General: She is not in acute distress.    Appearance: Normal appearance.  HENT:     Mouth/Throat:     Comments: MASK IN PLACE Eyes:     General: No scleral icterus.    Pupils: Pupils are equal, round, and reactive to light.  Neck:     Musculoskeletal: Normal range of motion.  Cardiovascular:     Rate and Rhythm: Normal rate and regular rhythm.     Pulses: Normal pulses.     Heart sounds: Normal heart sounds.  Pulmonary:     Effort: Pulmonary effort is normal.     Breath sounds: Normal breath sounds.  Abdominal:     General: Bowel sounds are normal. There is no distension.     Palpations: Abdomen is soft.     Tenderness: There is abdominal tenderness. There  is no guarding or rebound.     Comments: MILD TTP x4  Musculoskeletal:     Right lower leg: No edema.     Left lower leg: No edema.  Lymphadenopathy:     Cervical: No cervical adenopathy.  Skin:    General: Skin is warm and dry.  Neurological:     Mental Status: She is alert and oriented to person, place, and time.     Comments: NO  NEW FOCAL DEFICITS  Psychiatric:     Comments: FLAT AFFECT, SLIGHTLY depressed MOOD       Assessment & Plan:

## 2018-12-17 NOTE — Progress Notes (Signed)
ON RECALL  °

## 2018-12-18 ENCOUNTER — Other Ambulatory Visit: Payer: Self-pay

## 2018-12-18 ENCOUNTER — Ambulatory Visit (INDEPENDENT_AMBULATORY_CARE_PROVIDER_SITE_OTHER): Payer: Medicaid Other

## 2018-12-18 DIAGNOSIS — N926 Irregular menstruation, unspecified: Secondary | ICD-10-CM

## 2018-12-18 DIAGNOSIS — R102 Pelvic and perineal pain: Secondary | ICD-10-CM

## 2018-12-18 DIAGNOSIS — R1084 Generalized abdominal pain: Secondary | ICD-10-CM | POA: Diagnosis not present

## 2018-12-18 NOTE — Progress Notes (Signed)
PELVIC US TA/TV: homogeneous anteverted uterus,wnl,EEC 6 mm,normal ovaries,ovaries appear mobile,some discomfort during ultrasound

## 2019-01-05 DIAGNOSIS — R112 Nausea with vomiting, unspecified: Secondary | ICD-10-CM | POA: Diagnosis not present

## 2019-01-05 DIAGNOSIS — K5900 Constipation, unspecified: Secondary | ICD-10-CM | POA: Diagnosis not present

## 2019-02-02 DIAGNOSIS — K5900 Constipation, unspecified: Secondary | ICD-10-CM | POA: Diagnosis not present

## 2019-02-09 ENCOUNTER — Other Ambulatory Visit: Payer: Medicaid Other | Admitting: Adult Health

## 2019-02-18 ENCOUNTER — Other Ambulatory Visit: Payer: Self-pay | Admitting: Family Medicine

## 2019-02-23 DIAGNOSIS — K5900 Constipation, unspecified: Secondary | ICD-10-CM | POA: Diagnosis not present

## 2019-02-23 DIAGNOSIS — K6289 Other specified diseases of anus and rectum: Secondary | ICD-10-CM | POA: Diagnosis not present

## 2019-02-23 DIAGNOSIS — Z9049 Acquired absence of other specified parts of digestive tract: Secondary | ICD-10-CM | POA: Diagnosis not present

## 2019-02-23 DIAGNOSIS — K5902 Outlet dysfunction constipation: Secondary | ICD-10-CM | POA: Diagnosis not present

## 2019-03-04 ENCOUNTER — Other Ambulatory Visit: Payer: Self-pay | Admitting: Family Medicine

## 2019-03-12 DIAGNOSIS — Z3202 Encounter for pregnancy test, result negative: Secondary | ICD-10-CM | POA: Diagnosis not present

## 2019-03-12 DIAGNOSIS — K5900 Constipation, unspecified: Secondary | ICD-10-CM | POA: Diagnosis not present

## 2019-03-12 DIAGNOSIS — R112 Nausea with vomiting, unspecified: Secondary | ICD-10-CM | POA: Diagnosis not present

## 2019-03-12 DIAGNOSIS — R630 Anorexia: Secondary | ICD-10-CM | POA: Diagnosis not present

## 2019-03-12 DIAGNOSIS — R1084 Generalized abdominal pain: Secondary | ICD-10-CM | POA: Diagnosis not present

## 2019-03-12 DIAGNOSIS — Z8719 Personal history of other diseases of the digestive system: Secondary | ICD-10-CM | POA: Diagnosis not present

## 2019-03-12 DIAGNOSIS — R519 Headache, unspecified: Secondary | ICD-10-CM | POA: Diagnosis not present

## 2019-03-12 DIAGNOSIS — M419 Scoliosis, unspecified: Secondary | ICD-10-CM | POA: Diagnosis not present

## 2019-03-30 ENCOUNTER — Encounter: Payer: Self-pay | Admitting: Gastroenterology

## 2019-04-01 DIAGNOSIS — R109 Unspecified abdominal pain: Secondary | ICD-10-CM | POA: Diagnosis not present

## 2019-04-01 DIAGNOSIS — Z5321 Procedure and treatment not carried out due to patient leaving prior to being seen by health care provider: Secondary | ICD-10-CM | POA: Diagnosis not present

## 2019-04-02 ENCOUNTER — Other Ambulatory Visit: Payer: Self-pay | Admitting: Family Medicine

## 2019-04-03 MED ORDER — XULANE 150-35 MCG/24HR TD PTWK: 1.0000 | MEDICATED_PATCH | TRANSDERMAL | 0 refills | Status: DC

## 2019-04-03 NOTE — Telephone Encounter (Signed)
Patient aware.  No openings until 05/06/2019.  Refill sent to get patient to appt

## 2019-04-03 NOTE — Telephone Encounter (Signed)
Patient needs to be seen for cpe

## 2019-04-20 DIAGNOSIS — K5904 Chronic idiopathic constipation: Secondary | ICD-10-CM | POA: Diagnosis not present

## 2019-04-20 DIAGNOSIS — R112 Nausea with vomiting, unspecified: Secondary | ICD-10-CM | POA: Diagnosis not present

## 2019-04-20 DIAGNOSIS — K5900 Constipation, unspecified: Secondary | ICD-10-CM | POA: Diagnosis not present

## 2019-04-21 DIAGNOSIS — K5904 Chronic idiopathic constipation: Secondary | ICD-10-CM | POA: Diagnosis not present

## 2019-04-21 DIAGNOSIS — R1084 Generalized abdominal pain: Secondary | ICD-10-CM | POA: Diagnosis not present

## 2019-04-21 DIAGNOSIS — M6289 Other specified disorders of muscle: Secondary | ICD-10-CM | POA: Diagnosis not present

## 2019-04-21 DIAGNOSIS — M6281 Muscle weakness (generalized): Secondary | ICD-10-CM | POA: Diagnosis not present

## 2019-04-21 DIAGNOSIS — M62838 Other muscle spasm: Secondary | ICD-10-CM | POA: Diagnosis not present

## 2019-05-06 ENCOUNTER — Other Ambulatory Visit: Payer: Self-pay

## 2019-05-06 ENCOUNTER — Ambulatory Visit (INDEPENDENT_AMBULATORY_CARE_PROVIDER_SITE_OTHER): Payer: Medicaid Other | Admitting: Family Medicine

## 2019-05-06 VITALS — BP 98/69 | HR 108 | Temp 98.2°F | Ht 63.0 in | Wt 108.0 lb

## 2019-05-06 DIAGNOSIS — Z3045 Encounter for surveillance of transdermal patch hormonal contraceptive device: Secondary | ICD-10-CM | POA: Diagnosis not present

## 2019-05-06 DIAGNOSIS — Z23 Encounter for immunization: Secondary | ICD-10-CM | POA: Diagnosis not present

## 2019-05-06 MED ORDER — XULANE 150-35 MCG/24HR TD PTWK: 1.0000 | MEDICATED_PATCH | TRANSDERMAL | 4 refills | Status: DC

## 2019-05-06 NOTE — Progress Notes (Signed)
Subjective: CC: Birth control refill PCP: Raliegh Ip, DO Amber Marsh is a 24 y.o. female presenting to clinic today for:  1.  Birth control refill Patient reports compliance with Xulane patch.  She reports periods are somewhat irregular.  She is sexually active.  Last menstrual cycle was 04/11/2019.  She continues to have quite a bit of weight loss and difficulty keeping food down secondary to chronic constipation.  She notes she is "not had a bowel movement in 12 months".  She is being followed very closely by [facility] medical for this.  There are plans for an EGD and a gastric emptying test coming up soon.  Management will be pending these tests.  She was started on Marinol but she has not found this has been very helpful.  She does "get the munchies".   ROS: Per HPI  Allergies  Allergen Reactions  . Motegrity [Prucalopride Succinate] Other (See Comments)    "made me feel like I was dying'  Chest tightness and shortness of breath  . Penicillins Other (See Comments)    From childhood: Did it involve swelling of the face/tongue/throat, SOB, or low BP? Unk Did it involve sudden or severe rash/hives, skin peeling, or any reaction on the inside of your mouth or nose? Unk Did you need to seek medical attention at a hospital or doctor's office? Yes When did it last happen? Childhood If all above answers are "NO", may proceed with cephalosporin use.    Clarise Cruz Extract [Nutritional Supplements] Nausea And Vomiting  . Morphine And Related Hypertension  . Latex Rash  . Tape Rash    Has needed Benadryl afterwards   Past Medical History:  Diagnosis Date  . Allergy    seasonal allergy  . Appendicitis 05/2016  . Asthma    exercise induced  . Constipation   . Migraines     Current Outpatient Medications:  .  albuterol (PROAIR HFA) 108 (90 Base) MCG/ACT inhaler, Inhale 1-2 puffs into the lungs every 4 (four) hours as needed for wheezing or shortness of  breath. (Needs to be seen before next refill), Disp: 6.7 g, Rfl: 0 .  dronabinol (MARINOL) 5 MG capsule, Take by mouth., Disp: , Rfl:  .  Ensure (ENSURE), Take 237 mLs by mouth. One can at 8:00 am, 12 noon, 4:00 pm and 8:00 pm, Disp: , Rfl:  .  linaclotide (LINZESS) 290 MCG CAPS capsule, Take 1 capsule (290 mcg total) by mouth daily., Disp: 30 capsule, Rfl: 5 .  loratadine (CLARITIN) 10 MG tablet, Take 1 tablet (10 mg total) by mouth daily., Disp: 90 tablet, Rfl: 4 .  metoCLOPramide (REGLAN) 10 MG tablet, 1 po at 8 am, 12n, 4 pm, and 8 pm. TRY TO EAT 30 MINS AFTER EACH DOSE., Disp: 120 tablet, Rfl: 11 .  norelgestromin-ethinyl estradiol (XULANE) 150-35 MCG/24HR transdermal patch, Place 1 patch onto the skin once a week. Needs to be seen for further refills., Disp: 4 patch, Rfl: 0 .  pantoprazole (PROTONIX) 40 MG tablet, Take 1 tablet (40 mg total) by mouth daily before breakfast. Take 30 minutes before breakfast, Disp: 90 tablet, Rfl: 3 .  promethazine (PHENERGAN) 12.5 MG tablet, Take 1 tablet (12.5 mg total) by mouth every 6 (six) hours as needed for nausea or vomiting., Disp: 30 tablet, Rfl: 0 .  SUMAtriptan (IMITREX) 50 MG tablet, TAKE 1 TABLET BY MOUTH EVERY 2 HRS AS NEEDED FOR MIGRAINE. MAY REPEAT 1X IN 2 HOURS IF HEADACHE PERSISTS OR RECURS., Disp:  10 tablet, Rfl: 1 .  polyethylene glycol powder (GLYCOLAX/MIRALAX) 17 GM/SCOOP powder, Take one capful once to twice daily for constipation (Patient not taking: Reported on 12/17/2018), Disp: 527 g, Rfl: 5 Social History   Socioeconomic History  . Marital status: Significant Other    Spouse name: Not on file  . Number of children: 0  . Years of education: Not on file  . Highest education level: Not on file  Occupational History  . Occupation: waitress    Comment: [employer]  Tobacco Use  . Smoking status: Current Every Day Smoker    Packs/day: 0.50    Years: 3.00    Pack years: 1.50    Types: Cigarettes  . Smokeless tobacco: Never Used    . Tobacco comment: one pack every 3 days  Substance and Sexual Activity  . Alcohol use: Yes    Comment: occasional  . Drug use: No  . Sexual activity: Yes    Birth control/protection: Patch  Other Topics Concern  . Not on file  Social History Narrative  . Not on file   Social Determinants of Health   Financial Resource Strain:   . Difficulty of Paying Living Expenses:   Food Insecurity:   . Worried About Programme researcher, broadcasting/film/video in the Last Year:   . Barista in the Last Year:   Transportation Needs:   . Freight forwarder (Medical):   Marland Kitchen Lack of Transportation (Non-Medical):   Physical Activity:   . Days of Exercise per Week:   . Minutes of Exercise per Session:   Stress:   . Feeling of Stress :   Social Connections:   . Frequency of Communication with Friends and Family:   . Frequency of Social Gatherings with Friends and Family:   . Attends Religious Services:   . Active Member of Clubs or Organizations:   . Attends Banker Meetings:   Marland Kitchen Marital Status:   Intimate Partner Violence:   . Fear of Current or Ex-Partner:   . Emotionally Abused:   Marland Kitchen Physically Abused:   . Sexually Abused:    Family History  Problem Relation Age of Onset  . Migraines Mother   . Ovarian cysts Mother   . Hypertension Father   . Hypertension Maternal Grandmother   . Migraines Maternal Grandmother   . Cervical cancer Maternal Grandmother   . Diabetes Paternal Grandmother   . Crohn's disease Neg Hx   . Colon cancer Neg Hx   . Ulcerative colitis Neg Hx     Objective: Office vital signs reviewed. BP 98/69   Pulse (!) 108   Temp 98.2 F (36.8 C) (Temporal)   Ht 5\' 3"  (1.6 m)   Wt 108 lb (49 kg)   LMP 04/11/2019 (Exact Date)   BMI 19.13 kg/m   Physical Examination:  General: Awake, alert, thin, No acute distress HEENT: Normal, sclera white Cardio: regular rate and rhythm, S1S2 heard, no murmurs appreciated Pulm: clear to auscultation bilaterally, no  wheezes, rhonchi or rales; normal work of breathing on room air  Assessment/ Plan: 24 y.o. female   1. Encounter for surveillance of transdermal patch hormonal contraceptive device She has lost quite a bit of weight since our last visit.  I am very interested to know what they find on her upcoming tests as her case seems very unusual.  Continue Xulane.  Follow-up as needed or in 1 year. - norelgestromin-ethinyl estradiol 36) 150-35 MCG/24HR transdermal patch; Place 1 patch onto  the skin once a week.  Dispense: 12 patch; Refill: 4   No orders of the defined types were placed in this encounter.  No orders of the defined types were placed in this encounter.    Janora Norlander, DO [place] [phone]

## 2019-05-06 NOTE — Addendum Note (Signed)
Addended byDory Peru on: 05/06/2019 04:49 PM   Modules accepted: Orders

## 2019-05-13 DIAGNOSIS — R112 Nausea with vomiting, unspecified: Secondary | ICD-10-CM | POA: Diagnosis not present

## 2019-05-21 DIAGNOSIS — R112 Nausea with vomiting, unspecified: Secondary | ICD-10-CM | POA: Diagnosis not present

## 2019-05-28 ENCOUNTER — Other Ambulatory Visit: Payer: Self-pay

## 2019-05-28 ENCOUNTER — Ambulatory Visit: Payer: Medicaid Other | Attending: Internal Medicine

## 2019-05-28 DIAGNOSIS — Z20822 Contact with and (suspected) exposure to covid-19: Secondary | ICD-10-CM

## 2019-05-29 LAB — NOVEL CORONAVIRUS, NAA: SARS-CoV-2, NAA: NOT DETECTED

## 2019-05-29 LAB — SARS-COV-2, NAA 2 DAY TAT

## 2019-06-02 DIAGNOSIS — K3184 Gastroparesis: Secondary | ICD-10-CM | POA: Diagnosis not present

## 2019-06-02 DIAGNOSIS — R52 Pain, unspecified: Secondary | ICD-10-CM | POA: Diagnosis not present

## 2019-06-02 DIAGNOSIS — R634 Abnormal weight loss: Secondary | ICD-10-CM | POA: Diagnosis not present

## 2019-06-02 DIAGNOSIS — K5900 Constipation, unspecified: Secondary | ICD-10-CM | POA: Diagnosis not present

## 2019-06-29 DIAGNOSIS — R634 Abnormal weight loss: Secondary | ICD-10-CM | POA: Diagnosis not present

## 2019-06-29 DIAGNOSIS — K5900 Constipation, unspecified: Secondary | ICD-10-CM | POA: Diagnosis not present

## 2019-06-29 DIAGNOSIS — R112 Nausea with vomiting, unspecified: Secondary | ICD-10-CM | POA: Diagnosis not present

## 2019-07-09 DIAGNOSIS — R1110 Vomiting, unspecified: Secondary | ICD-10-CM | POA: Diagnosis not present

## 2019-07-18 IMAGING — CT CT ABDOMEN AND PELVIS WITH CONTRAST
2 of 4 series · 16 of 46 positions shown, 18 images · IV contrast (omnipaque)
Comparison: None.

CLINICAL DATA: Abdominal pain with appendicitis suspected.

EXAM:
CT ABDOMEN AND PELVIS WITH CONTRAST
TECHNIQUE: Multidetector CT imaging of the abdomen and pelvis was performed
using the standard protocol following bolus administration of
intravenous contrast.
CONTRAST:  100mL OMNIPAQUE IOHEXOL 300 MG/ML  SOLN

[Series 3: abdomen 5.0 · axial · 0.83mm/px · z∈[+758,+1203]mm · 13 of 101 slices shown, 15 images]
[im 6/101  soft-tissue]
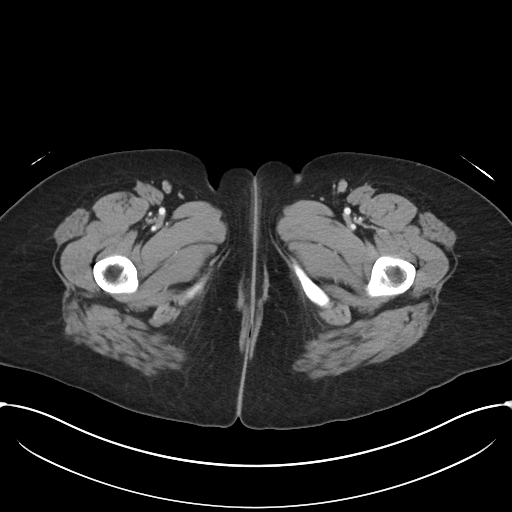
[im 6/101  bone]
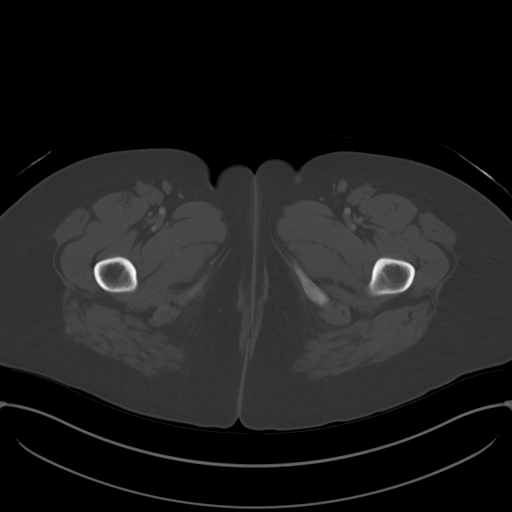
[im 16/101  soft-tissue]
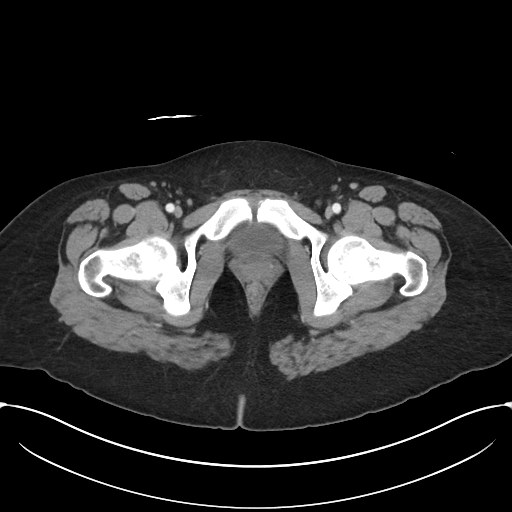
[im 22/101  soft-tissue]
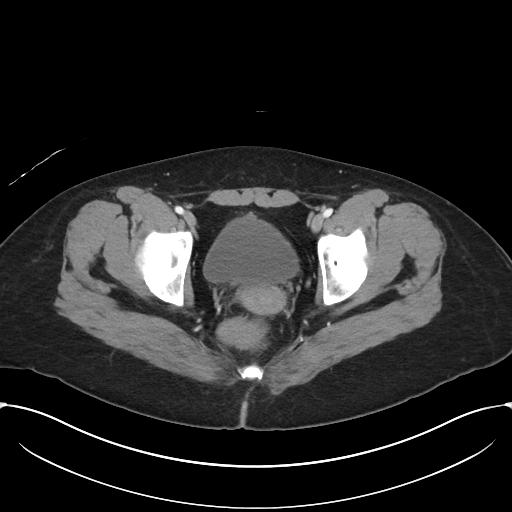
[im 27/101  soft-tissue]
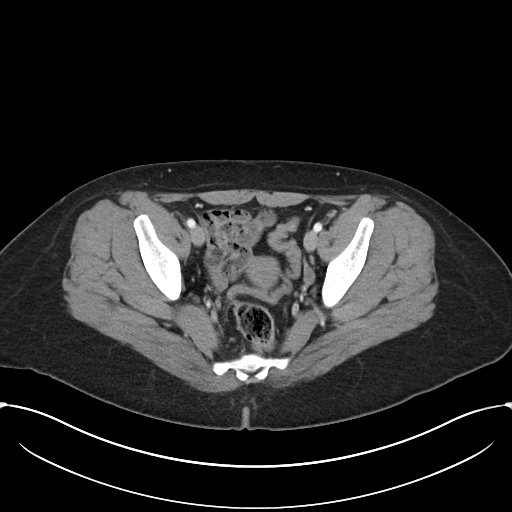
[im 37/101  soft-tissue]
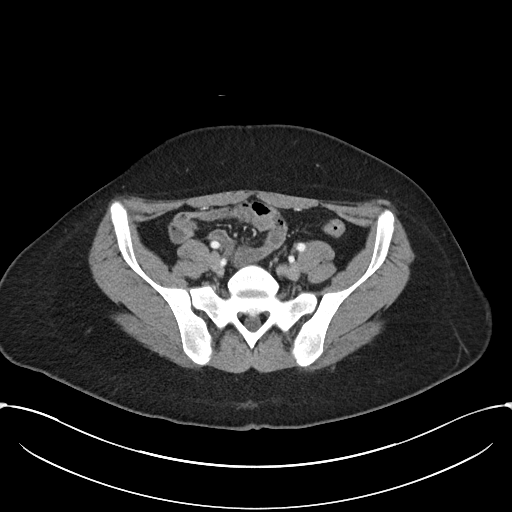
[im 43/101  soft-tissue]
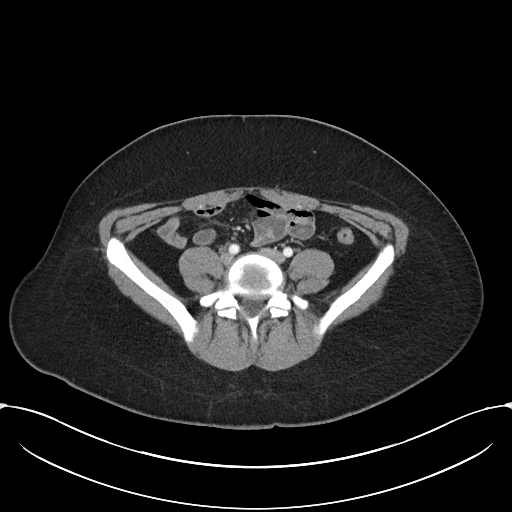
[im 53/101  soft-tissue]
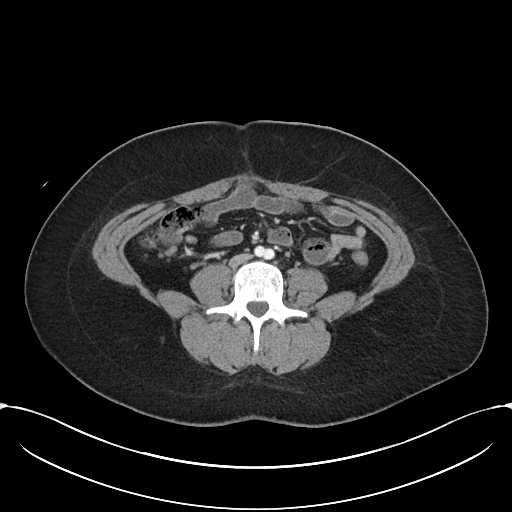
[im 58/101  soft-tissue]
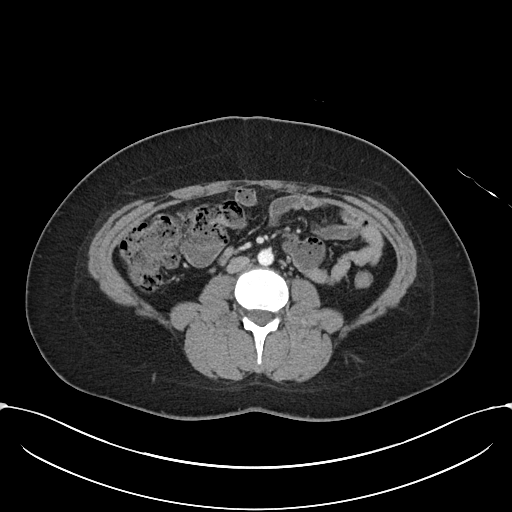
[im 64/101  soft-tissue]
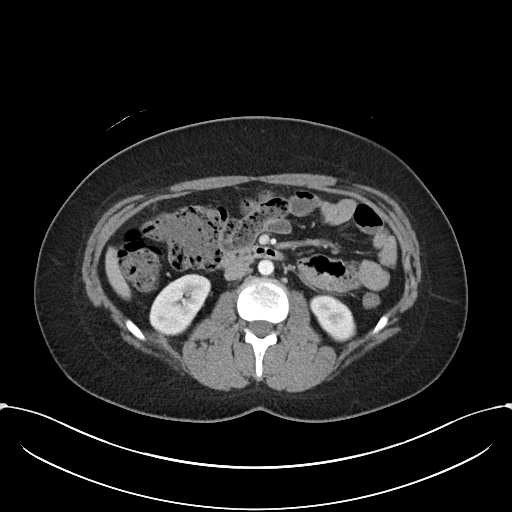
[im 64/101  bone]
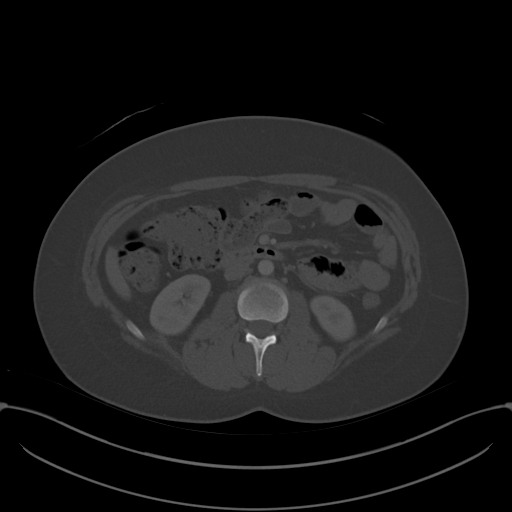
[im 74/101  soft-tissue]
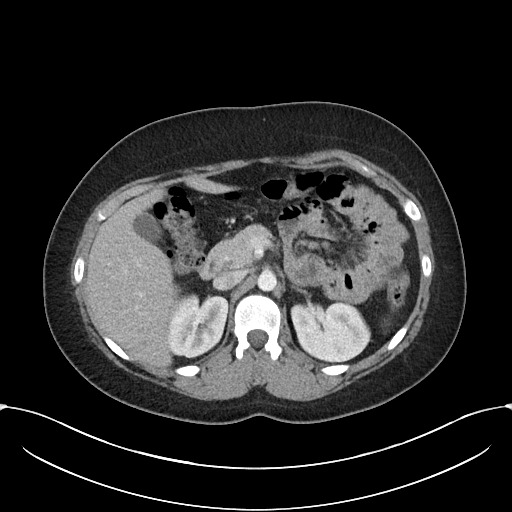
[im 79/101  soft-tissue]
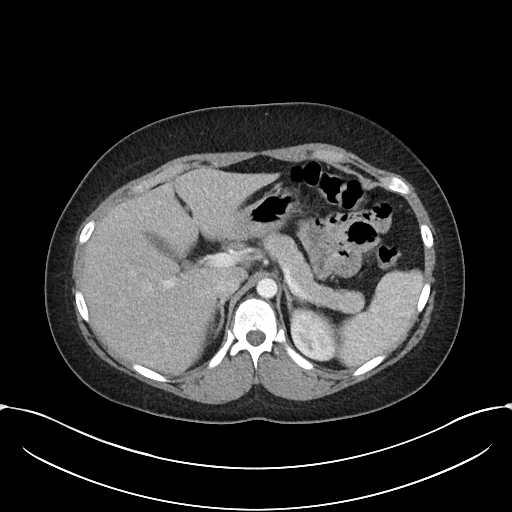
[im 85/101  soft-tissue]
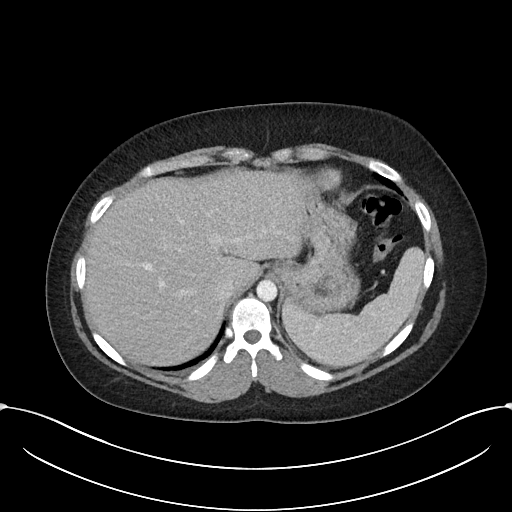
[im 95/101  soft-tissue]
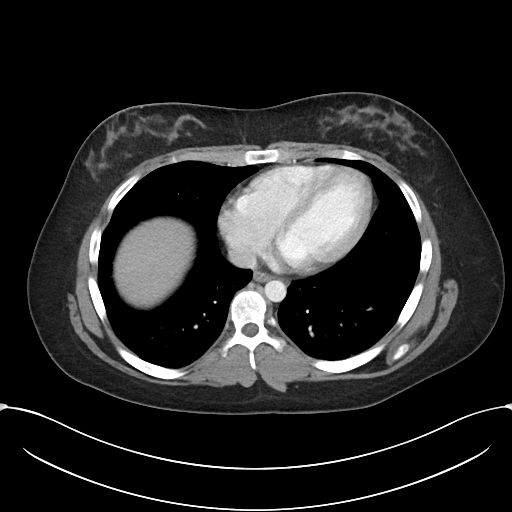

[Series 6: abdomen 3.0 mpr cor · coronal · 0.87mm/px · 3 of 101 slices shown]
[im 34/101  soft-tissue]
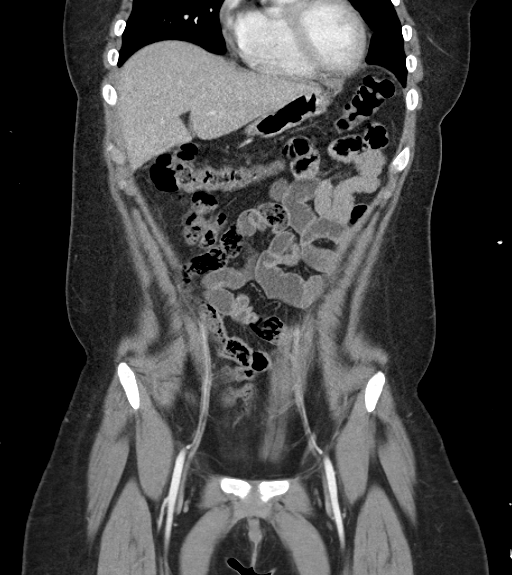
[im 45/101  soft-tissue]
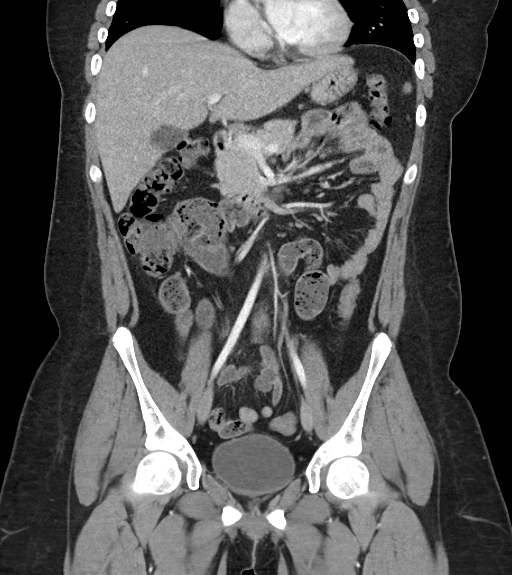
[im 56/101  soft-tissue]
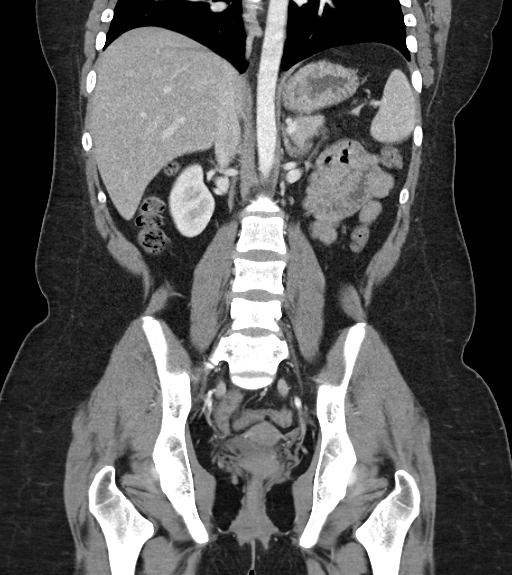

[16 of 46 positions shown; findings below may reference images not displayed]

FINDINGS: Lower chest: No acute abnormality.

Hepatobiliary: No focal liver abnormality is seen. No gallstones,
gallbladder wall thickening, or biliary dilatation.

Pancreas: Unremarkable. No pancreatic ductal dilatation or
surrounding inflammatory changes.

Spleen: Normal in size without focal abnormality.

Adrenals/Urinary Tract: Adrenal glands are unremarkable. Kidneys are
normal, without renal calculi, focal lesion, or hydronephrosis.
Bladder is unremarkable.

Stomach/Bowel: The patient appears to be status post prior
appendectomy. The appendix is not reliably identified. There is
fecalization of the terminal ileum consistent with slow transit or
stasis within the distal small bowel. The stomach is unremarkable.
There is no evidence of a small-bowel obstruction.

Vascular/Lymphatic: No significant vascular findings are present. No
enlarged abdominal or pelvic lymph nodes.

Reproductive: Uterus and bilateral adnexa are unremarkable.

Other: There is a small fat containing and questionable small bowel
containing periumbilical hernia.

Musculoskeletal: No acute or significant osseous findings.
IMPRESSION: No acute intra-abdominal abnormality. The patient appears to be
status post prior appendectomy. There are no inflammatory changes in
the right lower quadrant.

## 2019-08-07 DIAGNOSIS — J3489 Other specified disorders of nose and nasal sinuses: Secondary | ICD-10-CM | POA: Diagnosis not present

## 2019-08-07 DIAGNOSIS — R05 Cough: Secondary | ICD-10-CM | POA: Diagnosis not present

## 2019-08-07 DIAGNOSIS — J029 Acute pharyngitis, unspecified: Secondary | ICD-10-CM | POA: Diagnosis not present

## 2019-08-07 DIAGNOSIS — J0100 Acute maxillary sinusitis, unspecified: Secondary | ICD-10-CM | POA: Diagnosis not present

## 2019-08-24 ENCOUNTER — Encounter: Payer: Self-pay | Admitting: Family Medicine

## 2019-08-24 ENCOUNTER — Other Ambulatory Visit: Payer: Self-pay | Admitting: Family Medicine

## 2019-08-24 DIAGNOSIS — R634 Abnormal weight loss: Secondary | ICD-10-CM

## 2019-08-24 DIAGNOSIS — F509 Eating disorder, unspecified: Secondary | ICD-10-CM

## 2019-08-27 ENCOUNTER — Telehealth: Payer: Self-pay | Admitting: Family Medicine

## 2019-08-29 IMAGING — DX ABDOMEN - 2 VIEW
2 series · 2 of 2 positions shown · non-contrast
Comparison: Two days ago

CLINICAL DATA: Abdominal pain and constipation

EXAM:
ABDOMEN - 2 VIEW

[abdomen erect]
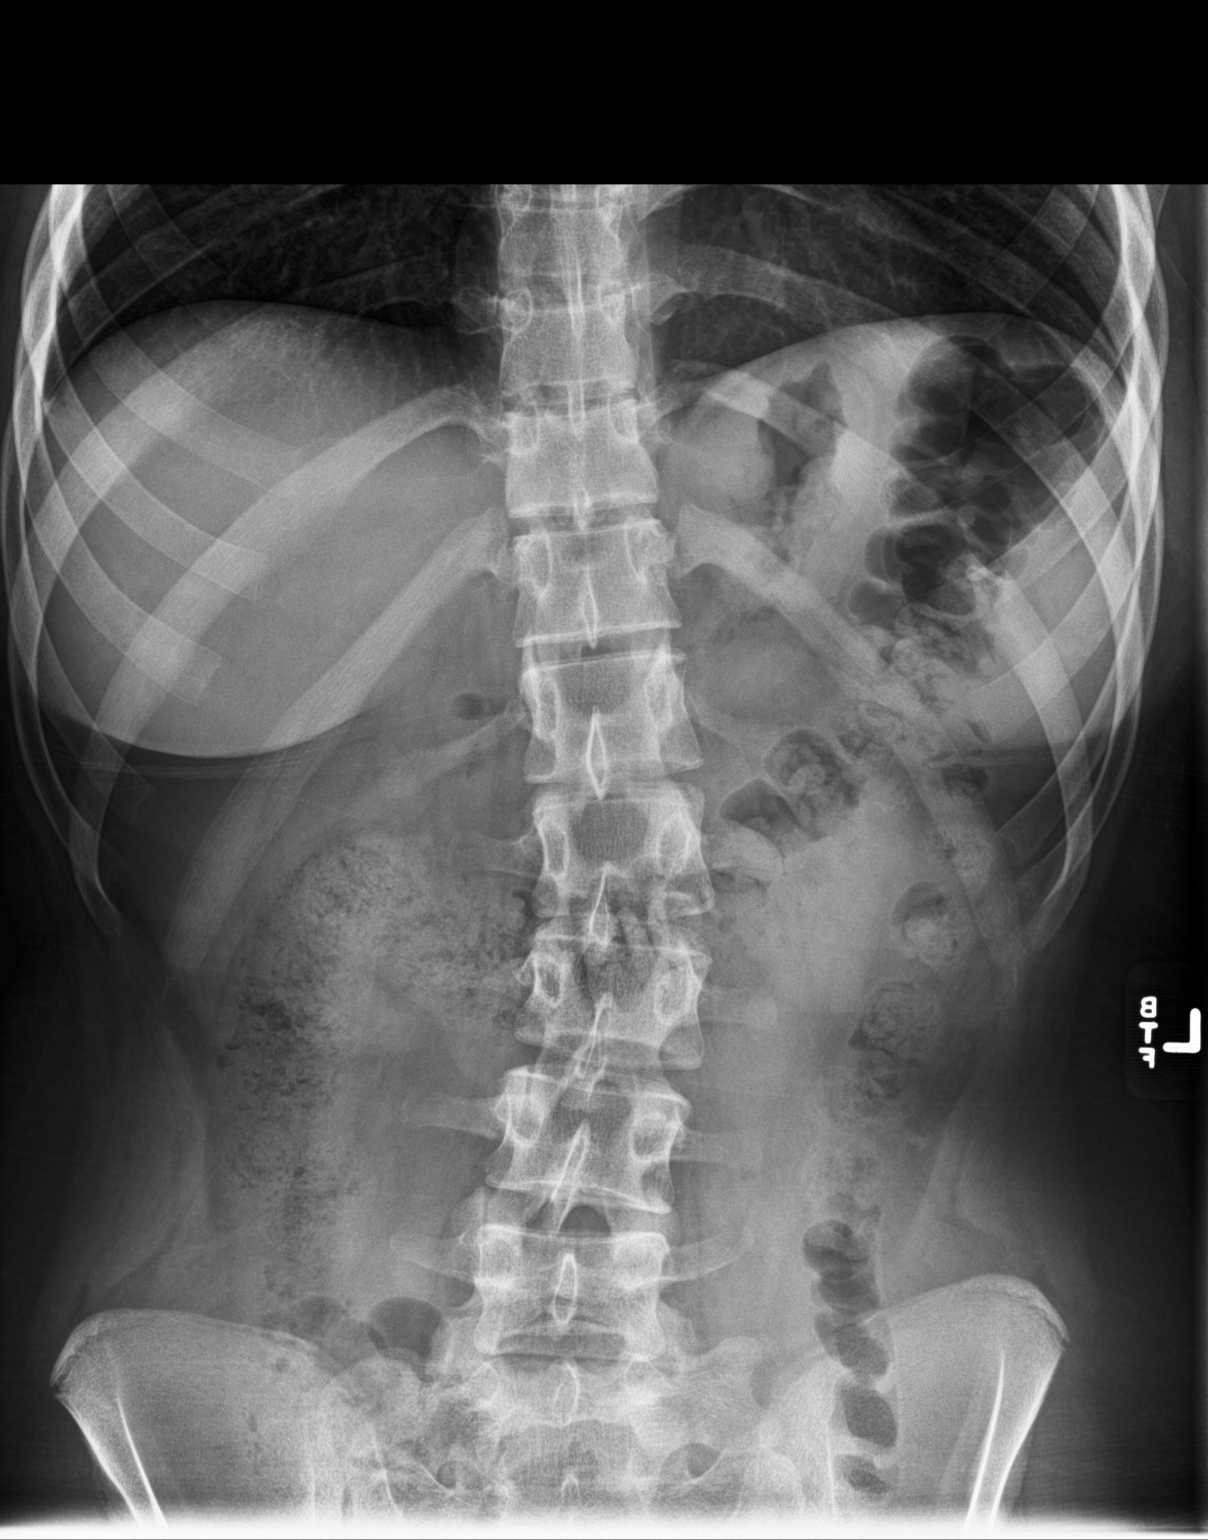

[abdomen supine]
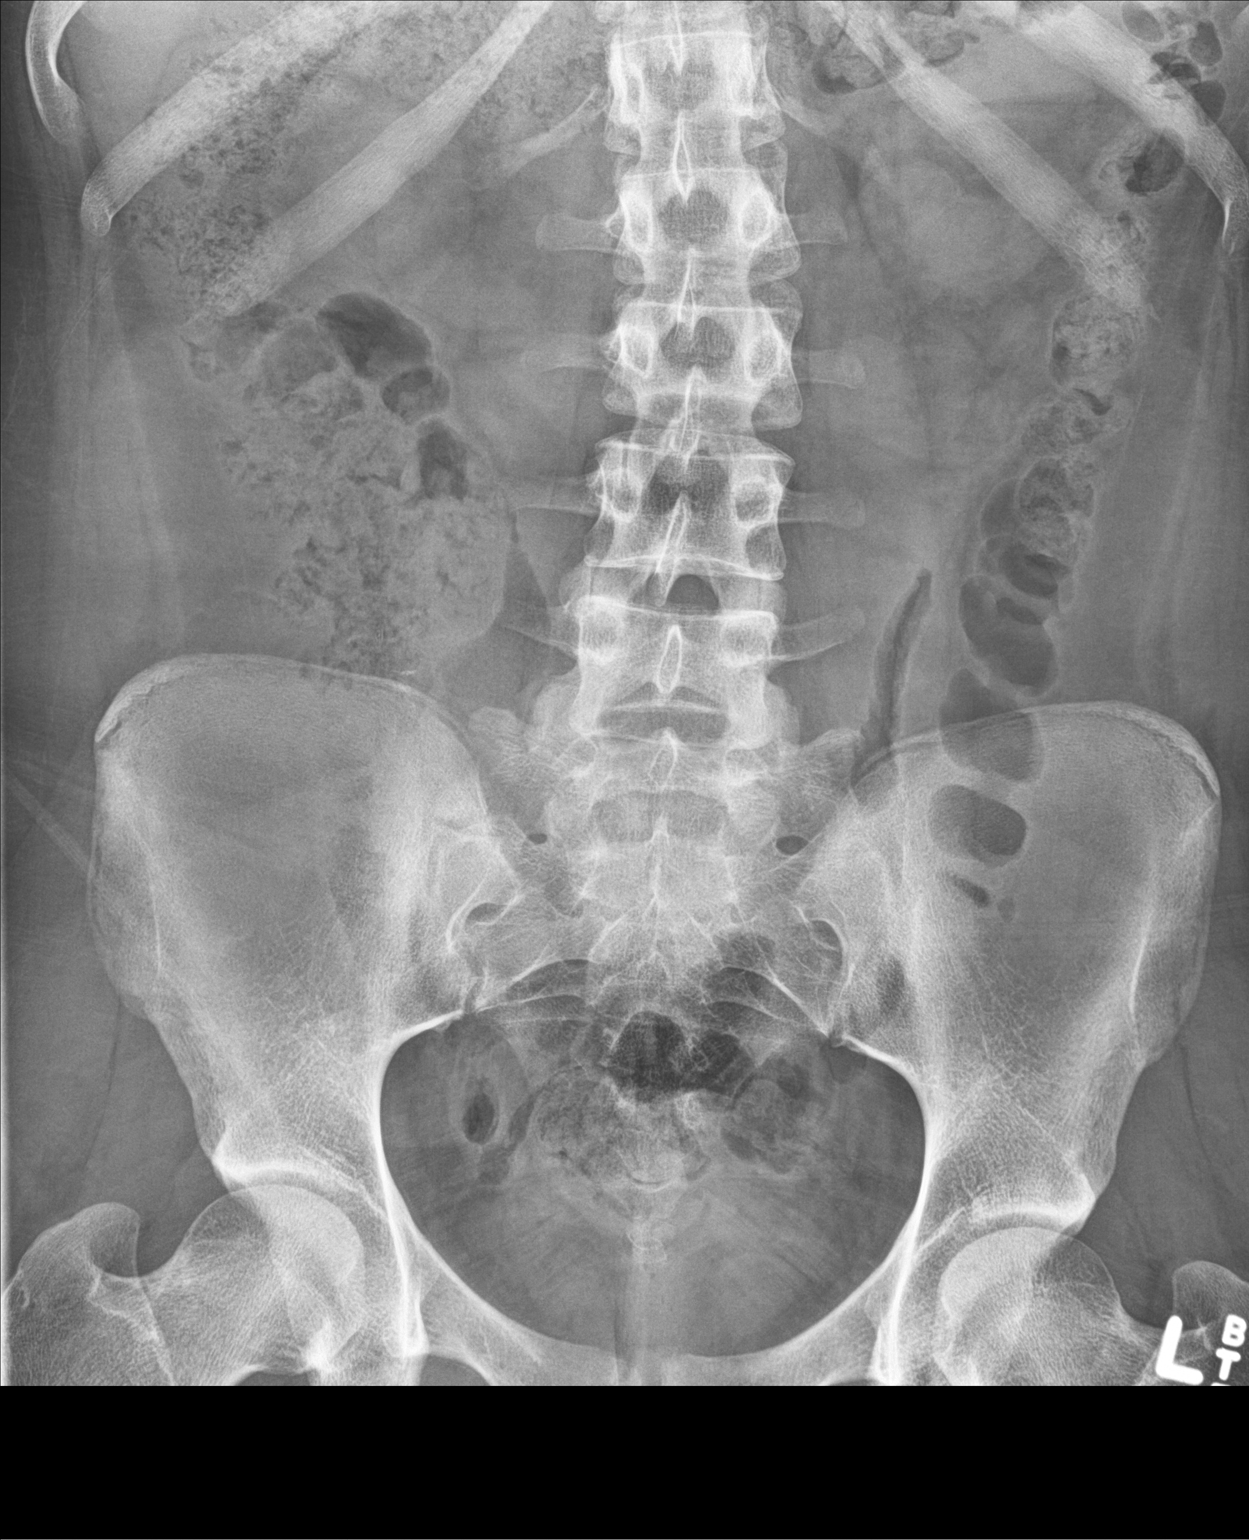

[2 of 2 positions shown; findings below may reference images not displayed]

FINDINGS: Moderate formed stool within the colon. No rectal impaction or
evident bowel obstruction. No concerning mass effect or
calcification. No pneumoperitoneum. Lung bases are clear. Mild
lumbar levoscoliosis.
IMPRESSION: Moderate stool volume without obstruction or rectal impaction.

## 2019-08-31 ENCOUNTER — Telehealth: Payer: Self-pay | Admitting: Family Medicine

## 2019-09-07 ENCOUNTER — Telehealth: Payer: Self-pay | Admitting: Family Medicine

## 2019-09-07 NOTE — Telephone Encounter (Signed)
I don't specialize in eating disorders but agree she should be seen to evaluate for physical reprocussions of eating disrders (hypotension, tachycardia, etc).  She was referred to specialist.  Did she schedule an appt??

## 2019-09-07 NOTE — Telephone Encounter (Signed)
Appt scheduled father can not find one who takes her insurance right now. Patient father just feels comfortable if you check her out first

## 2019-09-07 NOTE — Telephone Encounter (Signed)
Please advise bottom note would you like to see her sooner?

## 2019-09-07 NOTE — Telephone Encounter (Signed)
Courtney Patient dad is looking for psychologist that takes his Daughter insurance can you tell me any so I can call him back and let him know. Thank you.

## 2019-09-07 NOTE — Telephone Encounter (Signed)
Dad states that patient has been seen at [facility] but they can not find nothing wrong.  States that all the test are normal.  They told her to follow up with PCP because they think there is something else going on.  Please advise

## 2019-09-07 NOTE — Telephone Encounter (Signed)
Again, she is welcome to come and see me but I'm not a psychiatrist and this is not my specialty.  To be perfectly clear, she saw the Psychologist?  I did not see any notes.  Last note was that the father and patient were told to call and independently set up a psych appt.

## 2019-09-07 NOTE — Telephone Encounter (Signed)
Father Reuel Boom wants to talk to [place] or nurse about pt losing weight. She is at 99lbs. Father says that it is serious and her specialist at [facility] told her to see pcp because specialist is thinking this may be an eating disorder. I offered an apt but would not be until October. Please call back

## 2019-09-07 NOTE — Telephone Encounter (Signed)
I have no clue as to whom her insurance covers.  However, if he contacts the insurance company they should be able to give a list of local providers that they do pay for.

## 2019-09-11 ENCOUNTER — Encounter: Payer: Self-pay | Admitting: Family Medicine

## 2019-09-11 ENCOUNTER — Other Ambulatory Visit: Payer: Self-pay

## 2019-09-11 ENCOUNTER — Ambulatory Visit (INDEPENDENT_AMBULATORY_CARE_PROVIDER_SITE_OTHER): Payer: Medicaid Other | Admitting: Family Medicine

## 2019-09-11 VITALS — BP 99/68 | HR 67 | Temp 97.9°F | Ht 63.0 in | Wt 99.0 lb

## 2019-09-11 DIAGNOSIS — E440 Moderate protein-calorie malnutrition: Secondary | ICD-10-CM

## 2019-09-11 DIAGNOSIS — F509 Eating disorder, unspecified: Secondary | ICD-10-CM

## 2019-09-11 LAB — URINALYSIS
Bilirubin, UA: NEGATIVE
Glucose, UA: NEGATIVE
Ketones, UA: NEGATIVE
Nitrite, UA: NEGATIVE
Protein,UA: NEGATIVE
RBC, UA: NEGATIVE
Specific Gravity, UA: 1.02 (ref 1.005–1.030)
Urobilinogen, Ur: 1 mg/dL (ref 0.2–1.0)
pH, UA: 8.5 — ABNORMAL HIGH (ref 5.0–7.5)

## 2019-09-11 MED ORDER — MIRTAZAPINE 15 MG PO TABS: 15.0000 mg | ORAL_TABLET | Freq: Every day | ORAL | 1 refills | Status: DC

## 2019-09-11 NOTE — Patient Instructions (Signed)
You had labs performed today.  You will be contacted with the results of the labs once they are available, usually in the next 3 business days for routine lab work.  If you have an active my chart account, they will be released to your MyChart.  If you prefer to have these labs released to you via telephone, please let us know.  If you had a pap smear or biopsy performed, expect to be contacted in about 7-10 days.  Make sure to call the psychiatrist office.

## 2019-09-11 NOTE — Progress Notes (Signed)
Subjective: CC: Follow-up weight loss PCP: Janora Norlander, DO [id number] Driver is a 24 y.o. female presenting to clinic today for:  1.  Weight loss Patient with ongoing weight loss.  She states that she cannot really eat anything solid.  She is able to keep down fluids, ensures and rarely broth.  She has not had a bowel movement in 7 months per her report, not even a small smear.  She has been seen by multiple providers and was told that "it is all in her head".  She denies any dizziness, tachycardia, loss of consciousness.   ROS: Per HPI  Allergies  Allergen Reactions  . Motegrity [Prucalopride Succinate] Other (See Comments)    "made me feel like I was dying'  Chest tightness and shortness of breath  . Penicillins Other (See Comments)    From childhood: Did it involve swelling of the face/tongue/throat, SOB, or low BP? Unk Did it involve sudden or severe rash/hives, skin peeling, or any reaction on the inside of your mouth or nose? Unk Did you need to seek medical attention at a hospital or doctor's office? Yes When did it last happen? Childhood If all above answers are "NO", may proceed with cephalosporin use.    Lorenza Chick Extract [Nutritional Supplements] Nausea And Vomiting  . Morphine And Related Hypertension  . Latex Rash  . Tape Rash    Has needed Benadryl afterwards   Past Medical History:  Diagnosis Date  . Allergy    seasonal allergy  . Appendicitis 05/2016  . Asthma    exercise induced  . Constipation   . Migraines     Current Outpatient Medications:  .  albuterol (PROAIR HFA) 108 (90 Base) MCG/ACT inhaler, Inhale 1-2 puffs into the lungs every 4 (four) hours as needed for wheezing or shortness of breath. (Needs to be seen before next refill), Disp: 6.7 g, Rfl: 0 .  dronabinol (MARINOL) 5 MG capsule, Take by mouth., Disp: , Rfl:  .  Ensure (ENSURE), Take 237 mLs by mouth. One can at 8:00 am, 12 noon, 4:00 pm and 8:00 pm, Disp: , Rfl:  .   loratadine (CLARITIN) 10 MG tablet, Take 1 tablet (10 mg total) by mouth daily., Disp: 90 tablet, Rfl: 4 .  norelgestromin-ethinyl estradiol (XULANE) 150-35 MCG/24HR transdermal patch, Place 1 patch onto the skin once a week., Disp: 12 patch, Rfl: 4 .  pantoprazole (PROTONIX) 40 MG tablet, Take 1 tablet (40 mg total) by mouth daily before breakfast. Take 30 minutes before breakfast, Disp: 90 tablet, Rfl: 3 .  promethazine (PHENERGAN) 12.5 MG tablet, Take 1 tablet (12.5 mg total) by mouth every 6 (six) hours as needed for nausea or vomiting., Disp: 30 tablet, Rfl: 0 .  SUMAtriptan (IMITREX) 50 MG tablet, TAKE 1 TABLET BY MOUTH EVERY 2 HRS AS NEEDED FOR MIGRAINE. MAY REPEAT 1X IN 2 HOURS IF HEADACHE PERSISTS OR RECURS., Disp: 10 tablet, Rfl: 1 .  polyethylene glycol powder (GLYCOLAX/MIRALAX) 17 GM/SCOOP powder, Take one capful once to twice daily for constipation (Patient not taking: Reported on 12/17/2018), Disp: 527 g, Rfl: 5 Social History   Socioeconomic History  . Marital status: Significant Other    Spouse name: Not on file  . Number of children: 0  . Years of education: Not on file  . Highest education level: Not on file  Occupational History  . Occupation: waitress    Comment: [employer]  Tobacco Use  . Smoking status: Current Every Day Smoker  Packs/day: 0.50    Years: 3.00    Pack years: 1.50    Types: Cigarettes  . Smokeless tobacco: Never Used  . Tobacco comment: one pack every 3 days  Vaping Use  . Vaping Use: Some days  Substance and Sexual Activity  . Alcohol use: Yes    Comment: occasional  . Drug use: No  . Sexual activity: Yes    Birth control/protection: Patch  Other Topics Concern  . Not on file  Social History Narrative  . Not on file   Social Determinants of Health   Financial Resource Strain:   . Difficulty of Paying Living Expenses: Not on file  Food Insecurity:   . Worried About Charity fundraiser in the Last Year: Not on file  . Ran Out of Food  in the Last Year: Not on file  Transportation Needs:   . Lack of Transportation (Medical): Not on file  . Lack of Transportation (Non-Medical): Not on file  Physical Activity:   . Days of Exercise per Week: Not on file  . Minutes of Exercise per Session: Not on file  Stress:   . Feeling of Stress : Not on file  Social Connections:   . Frequency of Communication with Friends and Family: Not on file  . Frequency of Social Gatherings with Friends and Family: Not on file  . Attends Religious Services: Not on file  . Active Member of Clubs or Organizations: Not on file  . Attends Archivist Meetings: Not on file  . Marital Status: Not on file  Intimate Partner Violence:   . Fear of Current or Ex-Partner: Not on file  . Emotionally Abused: Not on file  . Physically Abused: Not on file  . Sexually Abused: Not on file   Family History  Problem Relation Age of Onset  . Migraines Mother   . Ovarian cysts Mother   . Hypertension Father   . Hypertension Maternal Grandmother   . Migraines Maternal Grandmother   . Cervical cancer Maternal Grandmother   . Diabetes Paternal Grandmother   . Crohn's disease Neg Hx   . Colon cancer Neg Hx   . Ulcerative colitis Neg Hx     Objective: Office vital signs reviewed. BP 99/68   Pulse 67   Temp 97.9 F (36.6 C) (Temporal)   Ht 5' 3"  (1.6 m)   Wt 99 lb (44.9 kg)   LMP 08/13/2019 (Approximate)   SpO2 97%   BMI 17.54 kg/m   Physical Examination:  General: Awake, alert, malnourished, thin, No acute distress HEENT: Normal, sclera white, MMM Cardio: regular rate and rhythm, S1S2 heard, no murmurs appreciated Pulm: clear to auscultation bilaterally, no wheezes, rhonchi or rales; normal work of breathing on room air GI: flat, soft, generalized TTP particularly in lower abdomen Extremities: warm, well perfused, No edema, cyanosis or clubbing; +2 pulses bilaterally MSK: normla gait and station Skin: dry; intact; no rashes or  lesions  Orthostatics: Lay 97/64 Sit 89/64 Stand 99/61  Depression screen [facility] 2/9 09/11/2019 05/06/2019 09/09/2018  Decreased Interest 0 0 0  Down, Depressed, Hopeless 0 0 0  PHQ - 2 Score 0 0 0  Altered sleeping 2 0 -  Tired, decreased energy 0 0 -  Change in appetite 3 0 -  Feeling bad or failure about yourself  0 0 -  Trouble concentrating 0 0 -  Moving slowly or fidgety/restless 0 0 -  Suicidal thoughts 0 0 -  PHQ-9 Score 5 0 -  Difficult doing work/chores Not difficult at all - -    Assessment/ Plan: 24 y.o. female   1. Moderate protein-calorie malnutrition ([place]) Handwritten prescription for Ensure 1 can 3 times daily written #1 month supply w 12 rf.  Start mirtazapine nightly.  I reinforced that her insurance unfortunately will not cover any of the providers that actually provide eating disorder treatment.  However, there is a form apparently that she can complete and get reimbursed for treatment at 1 of these providers.  I am also going to see about getting her into one of the local gastroenterologist office who also deals with eating disorders.  Today, orthostatics were obtained and there was no evidence of orthostasis.  She was not tachycardic.  Her mucous membranes were moist.  I am going to collect some laboratory work-up to further evaluate. - CMP14+EGFR - Thyroid Panel With TSH - Vitamin B12 - VITAMIN D 25 Hydroxy (Vit-D Deficiency, Fractures) - Ferritin - CBC with Differential - Urinalysis - mirtazapine (REMERON) 15 MG tablet; Take 1 tablet (15 mg total) by mouth at bedtime.  Dispense: 30 tablet; Refill: 1  2. Eating disorder, unspecified type - CMP14+EGFR - Thyroid Panel With TSH - Vitamin B12 - VITAMIN D 25 Hydroxy (Vit-D Deficiency, Fractures) - Ferritin - CBC with Differential - Urinalysis - mirtazapine (REMERON) 15 MG tablet; Take 1 tablet (15 mg total) by mouth at bedtime.  Dispense: 30 tablet; Refill: 1   No orders of the defined types were placed in  this encounter.  No orders of the defined types were placed in this encounter.    Janora Norlander, DO [place] [phone]

## 2019-09-12 LAB — CBC WITH DIFFERENTIAL/PLATELET
Basophils Absolute: 0 10*3/uL (ref 0.0–0.2)
Basos: 1 %
EOS (ABSOLUTE): 0.2 10*3/uL (ref 0.0–0.4)
Eos: 3 %
Hematocrit: 43.8 % (ref 34.0–46.6)
Hemoglobin: 14.9 g/dL (ref 11.1–15.9)
Immature Grans (Abs): 0 10*3/uL (ref 0.0–0.1)
Immature Granulocytes: 0 %
Lymphocytes Absolute: 2.8 10*3/uL (ref 0.7–3.1)
Lymphs: 46 %
MCH: 31 pg (ref 26.6–33.0)
MCHC: 34 g/dL (ref 31.5–35.7)
MCV: 91 fL (ref 79–97)
Monocytes Absolute: 0.4 10*3/uL (ref 0.1–0.9)
Monocytes: 7 %
Neutrophils Absolute: 2.6 10*3/uL (ref 1.4–7.0)
Neutrophils: 43 %
Platelets: 208 10*3/uL (ref 150–450)
RBC: 4.81 x10E6/uL (ref 3.77–5.28)
RDW: 11.8 % (ref 11.7–15.4)
WBC: 6.1 10*3/uL (ref 3.4–10.8)

## 2019-09-12 LAB — CMP14+EGFR
ALT: 7 IU/L (ref 0–32)
AST: 13 IU/L (ref 0–40)
Albumin/Globulin Ratio: 1.9 (ref 1.2–2.2)
Albumin: 4.6 g/dL (ref 3.9–5.0)
Alkaline Phosphatase: 73 IU/L (ref 48–121)
BUN/Creatinine Ratio: 10 (ref 9–23)
BUN: 7 mg/dL (ref 6–20)
Bilirubin Total: 0.5 mg/dL (ref 0.0–1.2)
CO2: 24 mmol/L (ref 20–29)
Calcium: 10 mg/dL (ref 8.7–10.2)
Chloride: 103 mmol/L (ref 96–106)
Creatinine, Ser: 0.67 mg/dL (ref 0.57–1.00)
GFR calc Af Amer: 145 mL/min/{1.73_m2} (ref >59)
GFR calc non Af Amer: 126 mL/min/{1.73_m2} (ref >59)
Globulin, Total: 2.4 g/dL (ref 1.5–4.5)
Glucose: 86 mg/dL (ref 65–99)
Potassium: 4.1 mmol/L (ref 3.5–5.2)
Sodium: 140 mmol/L (ref 134–144)
Total Protein: 7 g/dL (ref 6.0–8.5)

## 2019-09-12 LAB — THYROID PANEL WITH TSH
Free Thyroxine Index: 2.3 (ref 1.2–4.9)
T3 Uptake Ratio: 26 % (ref 24–39)
T4, Total: 9 ug/dL (ref 4.5–12.0)
TSH: 1.49 u[IU]/mL (ref 0.450–4.500)

## 2019-09-12 LAB — VITAMIN B12: Vitamin B-12: 466 pg/mL (ref 232–1245)

## 2019-09-12 LAB — FERRITIN: Ferritin: 129 ng/mL (ref 15–150)

## 2019-09-12 LAB — VITAMIN D 25 HYDROXY (VIT D DEFICIENCY, FRACTURES): Vit D, 25-Hydroxy: 29.1 ng/mL — ABNORMAL LOW (ref 30.0–100.0)

## 2019-10-03 ENCOUNTER — Encounter: Payer: Self-pay | Admitting: Family Medicine

## 2019-10-22 ENCOUNTER — Encounter: Payer: Self-pay | Admitting: Family Medicine

## 2019-10-23 ENCOUNTER — Other Ambulatory Visit: Payer: Self-pay | Admitting: Family Medicine

## 2019-10-23 DIAGNOSIS — F509 Eating disorder, unspecified: Secondary | ICD-10-CM

## 2019-10-23 DIAGNOSIS — E440 Moderate protein-calorie malnutrition: Secondary | ICD-10-CM

## 2019-10-23 DIAGNOSIS — Z3045 Encounter for surveillance of transdermal patch hormonal contraceptive device: Secondary | ICD-10-CM

## 2019-10-23 MED ORDER — XULANE 150-35 MCG/24HR TD PTWK: 1.0000 | MEDICATED_PATCH | TRANSDERMAL | 4 refills | Status: DC

## 2019-10-23 MED ORDER — MIRTAZAPINE 15 MG PO TABS: 15.0000 mg | ORAL_TABLET | Freq: Every day | ORAL | 1 refills | Status: DC

## 2019-11-02 MED ORDER — SUMATRIPTAN SUCCINATE 50 MG PO TABS: ORAL_TABLET | ORAL | 0 refills | Status: DC

## 2019-11-02 NOTE — Addendum Note (Signed)
Addended by: Tamera Punt on: 11/02/2019 08:41 AM   Modules accepted: Orders

## 2019-11-17 ENCOUNTER — Encounter: Payer: Self-pay | Admitting: Family Medicine

## 2019-11-20 ENCOUNTER — Other Ambulatory Visit: Payer: Self-pay | Admitting: Family Medicine

## 2019-11-20 DIAGNOSIS — E440 Moderate protein-calorie malnutrition: Secondary | ICD-10-CM

## 2019-11-20 DIAGNOSIS — R634 Abnormal weight loss: Secondary | ICD-10-CM

## 2019-11-20 DIAGNOSIS — K5900 Constipation, unspecified: Secondary | ICD-10-CM

## 2019-12-03 ENCOUNTER — Encounter: Payer: Self-pay | Admitting: Family Medicine

## 2019-12-09 DIAGNOSIS — W19XXXA Unspecified fall, initial encounter: Secondary | ICD-10-CM | POA: Diagnosis not present

## 2019-12-09 DIAGNOSIS — M545 Low back pain, unspecified: Secondary | ICD-10-CM | POA: Diagnosis not present

## 2019-12-10 ENCOUNTER — Encounter: Payer: Self-pay | Admitting: Family Medicine

## 2019-12-28 DIAGNOSIS — R1084 Generalized abdominal pain: Secondary | ICD-10-CM | POA: Diagnosis not present

## 2019-12-28 DIAGNOSIS — K5900 Constipation, unspecified: Secondary | ICD-10-CM | POA: Diagnosis not present

## 2019-12-28 DIAGNOSIS — J45909 Unspecified asthma, uncomplicated: Secondary | ICD-10-CM | POA: Diagnosis not present

## 2019-12-28 DIAGNOSIS — Z880 Allergy status to penicillin: Secondary | ICD-10-CM | POA: Diagnosis not present

## 2019-12-28 DIAGNOSIS — Z91040 Latex allergy status: Secondary | ICD-10-CM | POA: Diagnosis not present

## 2019-12-28 DIAGNOSIS — Z885 Allergy status to narcotic agent status: Secondary | ICD-10-CM | POA: Diagnosis not present

## 2019-12-28 DIAGNOSIS — R112 Nausea with vomiting, unspecified: Secondary | ICD-10-CM | POA: Diagnosis not present

## 2019-12-28 DIAGNOSIS — R109 Unspecified abdominal pain: Secondary | ICD-10-CM | POA: Diagnosis not present

## 2019-12-29 ENCOUNTER — Telehealth: Payer: Self-pay

## 2019-12-29 NOTE — Telephone Encounter (Signed)
Transition Care Management Unsuccessful Follow-up Telephone Call  Date of discharge and from where:  12/28/2019 [facility] ED  Attempts:  1st Attempt  Reason for unsuccessful TCM follow-up call:  No answer/busy

## 2019-12-30 NOTE — Telephone Encounter (Signed)
Patient has appointment scheduled with PCP Ashly Gottschalk,DO at [facility] on 01/12/2020 at 3:15pm.

## 2020-01-01 ENCOUNTER — Encounter: Payer: Self-pay | Admitting: Family Medicine

## 2020-01-12 ENCOUNTER — Other Ambulatory Visit: Payer: Self-pay

## 2020-01-12 ENCOUNTER — Ambulatory Visit (INDEPENDENT_AMBULATORY_CARE_PROVIDER_SITE_OTHER): Payer: Medicaid Other | Admitting: Family Medicine

## 2020-01-12 VITALS — BP 84/53 | HR 99 | Temp 98.0°F | Ht 63.0 in | Wt 96.0 lb

## 2020-01-12 DIAGNOSIS — I959 Hypotension, unspecified: Secondary | ICD-10-CM

## 2020-01-12 DIAGNOSIS — R109 Unspecified abdominal pain: Secondary | ICD-10-CM

## 2020-01-12 DIAGNOSIS — E440 Moderate protein-calorie malnutrition: Secondary | ICD-10-CM

## 2020-01-12 DIAGNOSIS — F509 Eating disorder, unspecified: Secondary | ICD-10-CM | POA: Diagnosis not present

## 2020-01-12 DIAGNOSIS — G8929 Other chronic pain: Secondary | ICD-10-CM | POA: Diagnosis not present

## 2020-01-12 MED ORDER — MIRTAZAPINE 30 MG PO TABS: 30.0000 mg | ORAL_TABLET | Freq: Every day | ORAL | 0 refills | Status: DC

## 2020-01-12 MED ORDER — CYPROHEPTADINE HCL 4 MG PO TABS: ORAL_TABLET | ORAL | 0 refills | Status: DC

## 2020-01-12 NOTE — Progress Notes (Signed)
Subjective: CC: Follow-up weight PCP: Raliegh Ip, DO [id number] Lame is a 24 y.o. female presenting to clinic today for:  1.  Malnourishment Patient with ongoing low appetite, generalized abdominal pain.  She is now able to at least get a couple of swallows of food and before she gets sick.  She has been drinking 5-8 ensures per day, supplementing her breakfast and supper with a breakfast mix that she uses with milk.  She gets her about 2 gallons of milk per week.  She is eating no solid foods.  She admits that she does not really hydrate well.  She contacted the behavioral specialist and they told her "that they would put her in a psych ward to induce a coma and feed her if she vomited".  She states that she will not return.  [facility] did not accept patient because really the entire work-up had been performed by [facility] medical.  She feels at a loss.  When she was in the emergency department recently after a fall she was told that she should consider cyproheptadine for appetite stimulation and that this would be okay to use with the Marinol.  She is asking for a trial of that today.   ROS: Per HPI  Allergies  Allergen Reactions   Motegrity [Prucalopride Succinate] Other (See Comments)    "made me feel like I was dying'  Chest tightness and shortness of breath   Penicillins Other (See Comments)    From childhood: Did it involve swelling of the face/tongue/throat, SOB, or low BP? Unk Did it involve sudden or severe rash/hives, skin peeling, or any reaction on the inside of your mouth or nose? Unk Did you need to seek medical attention at a hospital or doctor's office? Yes When did it last happen? Childhood If all above answers are "NO", may proceed with cephalosporin use.     Grapeseed Extract [Nutritional Supplements] Nausea And Vomiting   Morphine And Related Hypertension   Latex Rash   Tape Rash    Has needed Benadryl afterwards   Past Medical  History:  Diagnosis Date   Allergy    seasonal allergy   Appendicitis 05/2016   Asthma    exercise induced   Constipation    Migraines     Current Outpatient Medications:    albuterol (PROAIR HFA) 108 (90 Base) MCG/ACT inhaler, Inhale 1-2 puffs into the lungs every 4 (four) hours as needed for wheezing or shortness of breath. (Needs to be seen before next refill), Disp: 6.7 g, Rfl: 0   dronabinol (MARINOL) 5 MG capsule, Take by mouth., Disp: , Rfl:    Ensure (ENSURE), Take 237 mLs by mouth. One can at 8:00 am, 12 noon, 4:00 pm and 8:00 pm, Disp: , Rfl:    loratadine (CLARITIN) 10 MG tablet, Take 1 tablet (10 mg total) by mouth daily., Disp: 90 tablet, Rfl: 4   mirtazapine (REMERON) 15 MG tablet, Take 1 tablet (15 mg total) by mouth at bedtime., Disp: 90 tablet, Rfl: 1   norelgestromin-ethinyl estradiol (XULANE) 150-35 MCG/24HR transdermal patch, Place 1 patch onto the skin once a week., Disp: 12 patch, Rfl: 4   pantoprazole (PROTONIX) 40 MG tablet, Take 1 tablet (40 mg total) by mouth daily before breakfast. Take 30 minutes before breakfast, Disp: 90 tablet, Rfl: 3   polyethylene glycol powder (GLYCOLAX/MIRALAX) 17 GM/SCOOP powder, Take one capful once to twice daily for constipation (Patient not taking: Reported on 12/17/2018), Disp: 527 g, Rfl: 5  promethazine (PHENERGAN) 12.5 MG tablet, Take 1 tablet (12.5 mg total) by mouth every 6 (six) hours as needed for nausea or vomiting., Disp: 30 tablet, Rfl: 0   SUMAtriptan (IMITREX) 50 MG tablet, TAKE 1 TABLET BY MOUTH EVERY 2 HRS AS NEEDED FOR MIGRAINE. MAY REPEAT 1X IN 2 HOURS IF HEADACHE PERSISTS OR RECURS., Disp: 10 tablet, Rfl: 0 Social History   Socioeconomic History   Marital status: Significant Other    Spouse name: Not on file   Number of children: 0   Years of education: Not on file   Highest education level: Not on file  Occupational History   Occupation: waitress    Comment: [employer]  Tobacco Use    Smoking status: Current Every Day Smoker    Packs/day: 0.50    Years: 3.00    Pack years: 1.50    Types: Cigarettes   Smokeless tobacco: Never Used   Tobacco comment: one pack every 3 days  Vaping Use   Vaping Use: Some days  Substance and Sexual Activity   Alcohol use: Yes    Comment: occasional   Drug use: No   Sexual activity: Yes    Birth control/protection: Patch  Other Topics Concern   Not on file  Social History Narrative   Not on file   Social Determinants of Health   Financial Resource Strain: Not on file  Food Insecurity: Not on file  Transportation Needs: Not on file  Physical Activity: Not on file  Stress: Not on file  Social Connections: Not on file  Intimate Partner Violence: Not on file   Family History  Problem Relation Age of Onset   Migraines Mother    Ovarian cysts Mother    Hypertension Father    Hypertension Maternal Grandmother    Migraines Maternal Grandmother    Cervical cancer Maternal Grandmother    Diabetes Paternal Grandmother    Crohn's disease Neg Hx    Colon cancer Neg Hx    Ulcerative colitis Neg Hx     Objective: Office vital signs reviewed. BP (!) 84/53    Pulse 99    Temp 98 F (36.7 C) (Temporal)    Ht 5\' 3"  (1.6 m)    Wt 96 lb (43.5 kg)    SpO2 97%    BMI 17.01 kg/m   Physical Examination:  General: Awake, alert, malnourished. No acute distress HEENT: Sclera white.  No exophthalmos;  Cardio: regular rate and rhythm, S1S2 heard, no murmurs appreciated Pulm: clear to auscultation bilaterally, no wheezes, rhonchi or rales; normal work of breathing on room air GI: Flat, soft, generalized tenderness to palpation.  No organomegaly  Assessment/ Plan: 24 y.o. female   Eating disorder, unspecified type - Plan: cyproheptadine (PERIACTIN) 4 MG tablet, mirtazapine (REMERON) 30 MG tablet  Chronic abdominal pain - Plan: Cortisol-am, blood  Moderate protein-calorie malnutrition (HCC) - Plan: mirtazapine (REMERON)  30 MG tablet, Cortisol-am, blood  Hypotension, unspecified hypotension type - Plan: Cortisol-am, blood  Trial of periactin for appetite stimulation.  I have also increased her mirtazapine to 30 mg nightly.  She is instructed to maintain a food diary with intake, quantity, etc.  Encouraged hydration.  Continue meal supplements.  Trial of ice cream.  I would like to see her back in 4 weeks.  We will check a.m. cortisol.  I think that the hypertension noted over the last several visits is likely due to volume.  Hydration encouraged as above.  No orders of the defined types were  placed in this encounter.  No orders of the defined types were placed in this encounter.  Raliegh Ip, DO [facility] [phone]

## 2020-01-12 NOTE — Patient Instructions (Addendum)
This medication can cause sleepiness. Hold off on Claritin for now, while taking the Cyproheptadine Mirtazapine has been increased

## 2020-01-13 ENCOUNTER — Other Ambulatory Visit: Payer: Medicaid Other

## 2020-01-13 DIAGNOSIS — I959 Hypotension, unspecified: Secondary | ICD-10-CM

## 2020-01-13 DIAGNOSIS — E440 Moderate protein-calorie malnutrition: Secondary | ICD-10-CM

## 2020-01-13 DIAGNOSIS — G8929 Other chronic pain: Secondary | ICD-10-CM

## 2020-01-13 DIAGNOSIS — R109 Unspecified abdominal pain: Secondary | ICD-10-CM | POA: Diagnosis not present

## 2020-01-14 LAB — CORTISOL-AM, BLOOD: Cortisol - AM: 15 ug/dL (ref 6.2–19.4)

## 2020-01-16 ENCOUNTER — Encounter: Payer: Self-pay | Admitting: Family Medicine

## 2020-01-22 ENCOUNTER — Encounter: Payer: Self-pay | Admitting: *Deleted

## 2020-01-22 NOTE — Telephone Encounter (Signed)
Pts dad called to get lab results for Addisons Disease. Reviewed Dr Jeannett Senior note with dad. Dad voiced understanding. Wants to know what this means and what next steps are?

## 2020-02-18 ENCOUNTER — Emergency Department (HOSPITAL_COMMUNITY)
Admission: EM | Admit: 2020-02-18 | Discharge: 2020-02-18 | Disposition: A | Discharge status: Home / Self Care | Payer: Medicaid Other | Attending: Emergency Medicine | Admitting: Emergency Medicine

## 2020-02-18 ENCOUNTER — Encounter: Payer: Self-pay | Admitting: Family Medicine

## 2020-02-18 ENCOUNTER — Other Ambulatory Visit: Payer: Self-pay

## 2020-02-18 ENCOUNTER — Ambulatory Visit: Payer: Medicaid Other | Admitting: Family Medicine

## 2020-02-18 ENCOUNTER — Encounter (HOSPITAL_COMMUNITY): Payer: Self-pay | Admitting: Emergency Medicine

## 2020-02-18 VITALS — BP 92/62 | HR 99 | Temp 98.1°F | Ht 63.0 in | Wt 98.2 lb

## 2020-02-18 DIAGNOSIS — F509 Eating disorder, unspecified: Secondary | ICD-10-CM

## 2020-02-18 DIAGNOSIS — Z5321 Procedure and treatment not carried out due to patient leaving prior to being seen by health care provider: Secondary | ICD-10-CM | POA: Diagnosis not present

## 2020-02-18 DIAGNOSIS — E440 Moderate protein-calorie malnutrition: Secondary | ICD-10-CM

## 2020-02-18 DIAGNOSIS — R630 Anorexia: Secondary | ICD-10-CM | POA: Diagnosis not present

## 2020-02-18 DIAGNOSIS — G8929 Other chronic pain: Secondary | ICD-10-CM | POA: Diagnosis not present

## 2020-02-18 DIAGNOSIS — I951 Orthostatic hypotension: Secondary | ICD-10-CM

## 2020-02-18 DIAGNOSIS — E46 Unspecified protein-calorie malnutrition: Secondary | ICD-10-CM | POA: Insufficient documentation

## 2020-02-18 DIAGNOSIS — R109 Unspecified abdominal pain: Secondary | ICD-10-CM | POA: Diagnosis not present

## 2020-02-18 LAB — URINALYSIS, ROUTINE W REFLEX MICROSCOPIC
Bilirubin Urine: NEGATIVE
Glucose, UA: NEGATIVE mg/dL
Hgb urine dipstick: NEGATIVE
Ketones, ur: NEGATIVE mg/dL
Leukocytes,Ua: NEGATIVE
Nitrite: NEGATIVE
Protein, ur: NEGATIVE mg/dL
Specific Gravity, Urine: 1.011 (ref 1.005–1.030)
pH: 8 (ref 5.0–8.0)

## 2020-02-18 LAB — CBC
HCT: 40.5 % (ref 36.0–46.0)
Hemoglobin: 13.1 g/dL (ref 12.0–15.0)
MCH: 30.1 pg (ref 26.0–34.0)
MCHC: 32.3 g/dL (ref 30.0–36.0)
MCV: 93.1 fL (ref 80.0–100.0)
Platelets: 253 10*3/uL (ref 150–400)
RBC: 4.35 MIL/uL (ref 3.87–5.11)
RDW: 12.8 % (ref 11.5–15.5)
WBC: 7.8 10*3/uL (ref 4.0–10.5)
nRBC: 0 % (ref 0.0–0.2)

## 2020-02-18 LAB — I-STAT BETA HCG BLOOD, ED (MC, WL, AP ONLY): I-stat hCG, quantitative: <5 m[IU]/mL (ref <5)

## 2020-02-18 LAB — BASIC METABOLIC PANEL
Anion gap: 10 (ref 5–15)
BUN: 8 mg/dL (ref 6–20)
CO2: 24 mmol/L (ref 22–32)
Calcium: 9.3 mg/dL (ref 8.9–10.3)
Chloride: 106 mmol/L (ref 98–111)
Creatinine, Ser: 0.65 mg/dL (ref 0.44–1.00)
GFR, Estimated: >60 mL/min (ref >60)
Glucose, Bld: 88 mg/dL (ref 70–99)
Potassium: 3.7 mmol/L (ref 3.5–5.1)
Sodium: 140 mmol/L (ref 135–145)

## 2020-02-18 MED ORDER — MIRTAZAPINE 30 MG PO TABS: 45.0000 mg | ORAL_TABLET | Freq: Every day | ORAL | 0 refills | Status: DC

## 2020-02-18 NOTE — Patient Instructions (Signed)
You are orthostatic on today's exam You have had about a 2 pound weight gain but I still find this to be not sufficient.  I am going to contact the hospitalist at [facility] and potentially [facility] medical to see if we can get an admission today

## 2020-02-18 NOTE — ED Notes (Signed)
Called to recheck VS x3, no response.

## 2020-02-18 NOTE — Progress Notes (Signed)
Subjective: CC: Malnourishment, weight loss follow-up PCP: Amber Ip, DO ZOX:[id number] [name] is a 25 y.o. female presenting to clinic today for:  1.  Malnourishment Patient was seen at the end of December for ongoing weight loss, chronic generalized abdominal pain without any specific etiology despite multiple evaluations and what seems to be a very thorough work-up.  At that time she was tolerating 5-8 ensures per day and supplementing with a breakfast mix.  She was reluctant to seek psychiatric evaluation because she had been threatened that she would have to be placed in a "medication induced coma" to feed her if she vomited.  Did not feel that she was having significant appetite stimulation with use of Marinol  We increased her dose of mirtazapine to 30 mg nightly as well as added Periactin.  Meal supplementation was advised to be continued and she was going to trial on consumption of ice cream.  She had a cortisol level drawn in the morning which showed no evidence of metabolic etiology/Addison's.  She did have a low blood pressure and we thought that this to be secondary to inadequate volume.  Hydration was encouraged   She follows up today notes that she has had presyncopal episodes since we have seen each other last.  She has frequent episodes of dizziness with position changes.  She has not been able to keep any more food down than when we last talked.  She continues to have regurgitation of food after 4 bites.  She does feel that mood is more stable with increased dose of mirtazapine.  She is still not having any bowel movements and continues to have generalized abdominal pain.  Urine output is good per her report.  Continues to have menstrual cycles but attributes this to the birth control patch she uses.   ROS: Per HPI  Allergies  Allergen Reactions  . Motegrity [Prucalopride Succinate] Other (See Comments)    "made me feel like I was dying'  Chest tightness and  shortness of breath  . Penicillins Other (See Comments)    From childhood: Did it involve swelling of the face/tongue/throat, SOB, or low BP? Unk Did it involve sudden or severe rash/hives, skin peeling, or any reaction on the inside of your mouth or nose? Unk Did you need to seek medical attention at a hospital or doctor's office? Yes When did it last happen? Childhood If all above answers are "NO", may proceed with cephalosporin use.    Clarise Cruz Extract [Nutritional Supplements] Nausea And Vomiting  . Morphine And Related Hypertension  . Latex Rash  . Tape Rash    Has needed Benadryl afterwards   Past Medical History:  Diagnosis Date  . Allergy    seasonal allergy  . Appendicitis 05/2016  . Asthma    exercise induced  . Constipation   . Migraines     Current Outpatient Medications:  .  albuterol (PROAIR HFA) 108 (90 Base) MCG/ACT inhaler, Inhale 1-2 puffs into the lungs every 4 (four) hours as needed for wheezing or shortness of breath. (Needs to be seen before next refill), Disp: 6.7 g, Rfl: 0 .  cyproheptadine (PERIACTIN) 4 MG tablet, Take 0.5 tablets (2 mg total) by mouth 3 (three) times daily for 7 days, THEN 1 tablet (4 mg total) 3 (three) times daily., Disp: 80 tablet, Rfl: 0 .  dronabinol (MARINOL) 5 MG capsule, Take by mouth., Disp: , Rfl:  .  Ensure (ENSURE), Take 237 mLs by mouth. One can at 8:00  am, 12 noon, 4:00 pm and 8:00 pm, Disp: , Rfl:  .  mirtazapine (REMERON) 30 MG tablet, Take 1 tablet (30 mg total) by mouth at bedtime., Disp: 90 tablet, Rfl: 0 .  norelgestromin-ethinyl estradiol (XULANE) 150-35 MCG/24HR transdermal patch, Place 1 patch onto the skin once a week., Disp: 12 patch, Rfl: 4 .  pantoprazole (PROTONIX) 40 MG tablet, Take 1 tablet (40 mg total) by mouth daily before breakfast. Take 30 minutes before breakfast, Disp: 90 tablet, Rfl: 3 .  polyethylene glycol powder (GLYCOLAX/MIRALAX) 17 GM/SCOOP powder, Take one capful once to twice daily for  constipation (Patient not taking: Reported on 12/17/2018), Disp: 527 g, Rfl: 5 .  promethazine (PHENERGAN) 12.5 MG tablet, Take 1 tablet (12.5 mg total) by mouth every 6 (six) hours as needed for nausea or vomiting., Disp: 30 tablet, Rfl: 0 .  SUMAtriptan (IMITREX) 50 MG tablet, TAKE 1 TABLET BY MOUTH EVERY 2 HRS AS NEEDED FOR MIGRAINE. MAY REPEAT 1X IN 2 HOURS IF HEADACHE PERSISTS OR RECURS., Disp: 10 tablet, Rfl: 0 Social History   Socioeconomic History  . Marital status: Significant Other    Spouse name: Not on file  . Number of children: 0  . Years of education: Not on file  . Highest education level: Not on file  Occupational History  . Occupation: waitress    Comment: [employer]  Tobacco Use  . Smoking status: Current Every Day Smoker    Packs/day: 0.50    Years: 3.00    Pack years: 1.50    Types: Cigarettes  . Smokeless tobacco: Never Used  . Tobacco comment: one pack every 3 days  Vaping Use  . Vaping Use: Some days  Substance and Sexual Activity  . Alcohol use: Yes    Comment: occasional  . Drug use: No  . Sexual activity: Yes    Birth control/protection: Patch  Other Topics Concern  . Not on file  Social History Narrative  . Not on file   Social Determinants of Health   Financial Resource Strain: Not on file  Food Insecurity: Not on file  Transportation Needs: Not on file  Physical Activity: Not on file  Stress: Not on file  Social Connections: Not on file  Intimate Partner Violence: Not on file   Family History  Problem Relation Age of Onset  . Migraines Mother   . Ovarian cysts Mother   . Hypertension Father   . Hypertension Maternal Grandmother   . Migraines Maternal Grandmother   . Cervical cancer Maternal Grandmother   . Diabetes Paternal Grandmother   . Crohn's disease Neg Hx   . Colon cancer Neg Hx   . Ulcerative colitis Neg Hx     Objective: Office vital signs reviewed. BP 92/62   Pulse 99   Temp 98.1 F (36.7 C) (Temporal)   Ht 5'  3" (1.6 m)   Wt 98 lb 3.2 oz (44.5 kg)   SpO2 100%   BMI 17.40 kg/m   Physical Examination:  General: Awake, alert, emaciated, No acute distress HEENT: Sclera white. MM mildly tacky Cardio: No arrhythmia.   MSK: Ambulating independently but she has muscle atrophy  Lying: 94/64 HR 75 Sitting: 92/62 HR 99 Standing: 88/62 HR 120  Assessment/ Plan: 25 y.o. female   Moderate protein-calorie malnutrition (HCC)  Chronic abdominal pain  Orthostasis  Anorexia  I am very concerned given the degree of orthostasis that she is experiencing with associated presyncopal episodes.  She is really not gained any significant  weight since her last visit.  She is visibly emaciated and now with vital sign instabilities I do not think that we can continue trying to treat her outpatient.  I have advised her to seek care in the hospital.  I have spoken to Shanda Bumps, the triage nurse in the emergency department [facility], and advised her of my concerns and likely need for inpatient admission for tube feedings.  Patient is aware of my concerns and is agreeable to hospital admission.  I spoken to the patient's father as well and he agrees that she needs hospital admission and he would like her to have a psychiatric evaluation as well.  She has had a very extensive work-up with [facility] medical from a GI standpoint.  At this point I do think that psychiatric evaluation is needed.  No orders of the defined types were placed in this encounter.  Meds ordered this encounter  Medications  . mirtazapine (REMERON) 30 MG tablet    Sig: Take 1.5 tablets (45 mg total) by mouth at bedtime.    Dispense:  90 tablet    Refill:  0     Amber Marsh Hulen Skains, DO [facility] [phone]

## 2020-02-18 NOTE — ED Triage Notes (Addendum)
Pt arrives today from her pcp with concerns of malnutrition pt states she is unable to keep anything down every time she eats she immediately vomits. Was sent to ED for evaluation for need for feeding tube per pcp note.

## 2020-02-19 ENCOUNTER — Other Ambulatory Visit: Payer: Self-pay | Admitting: Family

## 2020-02-19 DIAGNOSIS — R64 Cachexia: Secondary | ICD-10-CM | POA: Diagnosis not present

## 2020-02-19 DIAGNOSIS — R634 Abnormal weight loss: Secondary | ICD-10-CM | POA: Diagnosis not present

## 2020-02-19 DIAGNOSIS — R633 Feeding difficulties, unspecified: Secondary | ICD-10-CM | POA: Diagnosis not present

## 2020-02-19 DIAGNOSIS — R112 Nausea with vomiting, unspecified: Secondary | ICD-10-CM | POA: Diagnosis not present

## 2020-02-19 DIAGNOSIS — Z9889 Other specified postprocedural states: Secondary | ICD-10-CM | POA: Diagnosis not present

## 2020-02-19 DIAGNOSIS — Z681 Body mass index (BMI) 19 or less, adult: Secondary | ICD-10-CM | POA: Diagnosis not present

## 2020-02-20 ENCOUNTER — Encounter: Payer: Self-pay | Admitting: Family Medicine

## 2020-02-22 ENCOUNTER — Ambulatory Visit: Payer: Medicaid Other | Admitting: Family Medicine

## 2020-02-22 ENCOUNTER — Telehealth: Payer: Self-pay

## 2020-02-22 NOTE — Telephone Encounter (Signed)
Spoke with patient's husband per DPR.  Appointment scheduled with Dr. Nadine Counts on 03/01/2020.

## 2020-02-23 DIAGNOSIS — K9049 Malabsorption due to intolerance, not elsewhere classified: Secondary | ICD-10-CM | POA: Diagnosis not present

## 2020-02-23 DIAGNOSIS — Z6841 Body Mass Index (BMI) 40.0 and over, adult: Secondary | ICD-10-CM | POA: Diagnosis not present

## 2020-02-23 DIAGNOSIS — R634 Abnormal weight loss: Secondary | ICD-10-CM | POA: Diagnosis not present

## 2020-02-23 DIAGNOSIS — K5904 Chronic idiopathic constipation: Secondary | ICD-10-CM | POA: Diagnosis not present

## 2020-02-25 ENCOUNTER — Encounter: Payer: Self-pay | Admitting: Family Medicine

## 2020-02-25 MED ORDER — ALBUTEROL SULFATE HFA 108 (90 BASE) MCG/ACT IN AERS: 1.0000 | INHALATION_SPRAY | RESPIRATORY_TRACT | 3 refills | Status: DC | PRN

## 2020-03-01 ENCOUNTER — Encounter: Payer: Self-pay | Admitting: Family Medicine

## 2020-03-01 ENCOUNTER — Other Ambulatory Visit: Payer: Self-pay | Admitting: Family Medicine

## 2020-03-01 ENCOUNTER — Ambulatory Visit (INDEPENDENT_AMBULATORY_CARE_PROVIDER_SITE_OTHER): Payer: Medicaid Other | Admitting: Family Medicine

## 2020-03-01 DIAGNOSIS — E440 Moderate protein-calorie malnutrition: Secondary | ICD-10-CM

## 2020-03-01 DIAGNOSIS — R109 Unspecified abdominal pain: Secondary | ICD-10-CM | POA: Diagnosis not present

## 2020-03-01 DIAGNOSIS — G8929 Other chronic pain: Secondary | ICD-10-CM

## 2020-03-01 DIAGNOSIS — R630 Anorexia: Secondary | ICD-10-CM | POA: Diagnosis not present

## 2020-03-01 DIAGNOSIS — I951 Orthostatic hypotension: Secondary | ICD-10-CM | POA: Diagnosis not present

## 2020-03-01 MED ORDER — SUMATRIPTAN SUCCINATE 50 MG PO TABS: ORAL_TABLET | ORAL | 1 refills | Status: DC

## 2020-03-01 MED ORDER — SUMATRIPTAN SUCCINATE 50 MG PO TABS: ORAL_TABLET | ORAL | 0 refills | Status: DC

## 2020-03-01 NOTE — Progress Notes (Signed)
Telephone visit  Subjective: CC: malnutrition, orthostasis PCP: Amber Ip, DO [id number] [name] is a 25 y.o. female calls for telephone consult today. Patient provides verbal consent for consult held via phone.  Due to COVID-19 pandemic this visit was conducted virtually. This visit type was conducted due to national recommendations for restrictions regarding the COVID-19 Pandemic (e.g. social distancing, sheltering in place) in an effort to limit this patient's exposure and mitigate transmission in our community. All issues noted in this document were discussed and addressed.  A physical exam was not performed with this format.   Location of patient: home Location of provider: [facility] Others present for call: boyfriend  1.  Malnutrition, orthostasis We saw each other on 3 February.  At that time she was experiencing orthostasis and other physical manifestations concerning for complications of her malnutrition/anorexia.  I had recommended that she be evaluated in the emergency department [facility] in fact had contacted the triage provider there.  Unfortunately, she left due to the wait time and subsequently went to [facility] medical for care.  She was not admitted but she was connected with the general surgeon and was referred to psychiatry.  She has not heard about any referral to psychiatry but did see the general surgeon and there are plans for NG tube placement on Friday.  She has a follow-up with internal medicine tomorrow.  She has been trying to keep up with fluids (ensure/ nutrition) and notes that the dizziness has gotten slightly better but she continues to experience some dizziness with positional changes.  No loss of consciousness.   ROS: Per HPI  Allergies  Allergen Reactions  . Motegrity [Prucalopride Succinate] Other (See Comments)    "made me feel like I was dying'  Chest tightness and shortness of breath  . Penicillins Other (See Comments)    From  childhood: Did it involve swelling of the face/tongue/throat, SOB, or low BP? Unk Did it involve sudden or severe rash/hives, skin peeling, or any reaction on the inside of your mouth or nose? Unk Did you need to seek medical attention at a hospital or doctor's office? Yes When did it last happen? Childhood If all above answers are "NO", may proceed with cephalosporin use.    Amber Marsh Extract [Nutritional Supplements] Nausea And Vomiting  . Morphine And Related Hypertension  . Latex Rash  . Tape Rash    Has needed Benadryl afterwards   Past Medical History:  Diagnosis Date  . Allergy    seasonal allergy  . Appendicitis 05/2016  . Asthma    exercise induced  . Constipation   . Migraines     Current Outpatient Medications:  .  albuterol (PROAIR HFA) 108 (90 Base) MCG/ACT inhaler, Inhale 1-2 puffs into the lungs every 4 (four) hours as needed for wheezing or shortness of breath. (Needs to be seen before next refill), Disp: 6.7 g, Rfl: 3 .  cyproheptadine (PERIACTIN) 4 MG tablet, Take 0.5 tablets (2 mg total) by mouth 3 (three) times daily for 7 days, THEN 1 tablet (4 mg total) 3 (three) times daily., Disp: 80 tablet, Rfl: 0 .  dronabinol (MARINOL) 5 MG capsule, Take by mouth., Disp: , Rfl:  .  Ensure (ENSURE), Take 237 mLs by mouth. One can at 8:00 am, 12 noon, 4:00 pm and 8:00 pm, Disp: , Rfl:  .  mirtazapine (REMERON) 30 MG tablet, Take 1.5 tablets (45 mg total) by mouth at bedtime., Disp: 90 tablet, Rfl: 0 .  norelgestromin-ethinyl  estradiol [employer]) 150-35 MCG/24HR transdermal patch, Place 1 patch onto the skin once a week., Disp: 12 patch, Rfl: 4 .  pantoprazole (PROTONIX) 40 MG tablet, Take 1 tablet (40 mg total) by mouth daily before breakfast. Take 30 minutes before breakfast, Disp: 90 tablet, Rfl: 3 .  promethazine (PHENERGAN) 12.5 MG tablet, Take 1 tablet (12.5 mg total) by mouth every 6 (six) hours as needed for nausea or vomiting., Disp: 30 tablet, Rfl: 0 .   SUMAtriptan (IMITREX) 50 MG tablet, TAKE 1 TABLET BY MOUTH EVERY 2 HRS AS NEEDED FOR MIGRAINE. MAY REPEAT 1X IN 2 HOURS IF HEADACHE PERSISTS OR RECURS., Disp: 10 tablet, Rfl: 0  Assessment/ Plan: 25 y.o. female   Moderate protein-calorie malnutrition (HCC)  Anorexia  Chronic abdominal pain  Orthostasis  Will be seeing the general surgeon for NG tube placement on Friday.  She has a follow-up visit with internal medicine tomorrow.  Apparently most of this refeeding is going to take place in the outpatient setting.  My concern is refeeding syndrome in this patient, particularly given physical manifestations of malnutrition (orthostasis, dizziness, etc.)  Not sure how this will be monitored for in the outpatient setting.  I asked that she have her specialist and follow-up visit information sent to me so that I can keep an eye on what the recommendations are.  Additionally, there apparently was a referral to psychiatry on the eighth through [facility] but I do not see where she has been reached out to.  I asked that she follow-up regarding this.  I certainly think that this would be instrumental in helping her recover  Start time: 4:09pm End time: 4:22pm  Total time spent on patient care (including telephone call/ virtual visit): 13 minutes  Amber Marsh Hulen Skains, DO [facility] [phone]

## 2020-03-02 ENCOUNTER — Encounter: Payer: Self-pay | Admitting: Family Medicine

## 2020-03-02 DIAGNOSIS — Z885 Allergy status to narcotic agent status: Secondary | ICD-10-CM | POA: Diagnosis not present

## 2020-03-02 DIAGNOSIS — R1110 Vomiting, unspecified: Secondary | ICD-10-CM | POA: Diagnosis not present

## 2020-03-02 DIAGNOSIS — R634 Abnormal weight loss: Secondary | ICD-10-CM | POA: Diagnosis not present

## 2020-03-02 DIAGNOSIS — Z681 Body mass index (BMI) 19 or less, adult: Secondary | ICD-10-CM | POA: Diagnosis not present

## 2020-03-02 DIAGNOSIS — R42 Dizziness and giddiness: Secondary | ICD-10-CM | POA: Diagnosis not present

## 2020-03-02 DIAGNOSIS — Z91040 Latex allergy status: Secondary | ICD-10-CM | POA: Diagnosis not present

## 2020-03-02 DIAGNOSIS — Z880 Allergy status to penicillin: Secondary | ICD-10-CM | POA: Diagnosis not present

## 2020-03-04 DIAGNOSIS — R634 Abnormal weight loss: Secondary | ICD-10-CM | POA: Diagnosis not present

## 2020-03-04 DIAGNOSIS — Z431 Encounter for attention to gastrostomy: Secondary | ICD-10-CM | POA: Diagnosis not present

## 2020-03-04 DIAGNOSIS — Z4659 Encounter for fitting and adjustment of other gastrointestinal appliance and device: Secondary | ICD-10-CM | POA: Diagnosis not present

## 2020-03-07 ENCOUNTER — Telehealth: Payer: Self-pay

## 2020-03-07 NOTE — Telephone Encounter (Signed)
Left detailed message on patients voicemail that GI would have to take care of feedings.

## 2020-03-07 NOTE — Telephone Encounter (Signed)
I am assuming GI but wanted to check with you first

## 2020-03-07 NOTE — Telephone Encounter (Signed)
Yes. I assume so.  This is why she was counseled on delaying NGT placement.  We've discussed at length that there was no plan in place for feeds, monitoring, etc.

## 2020-03-15 DIAGNOSIS — Z713 Dietary counseling and surveillance: Secondary | ICD-10-CM | POA: Diagnosis not present

## 2020-03-15 DIAGNOSIS — R638 Other symptoms and signs concerning food and fluid intake: Secondary | ICD-10-CM | POA: Diagnosis not present

## 2020-03-15 DIAGNOSIS — Z934 Other artificial openings of gastrointestinal tract status: Secondary | ICD-10-CM | POA: Diagnosis not present

## 2020-03-15 HISTORY — PX: REMOVAL OF GASTROSTOMY TUBE: SHX6058

## 2020-03-16 ENCOUNTER — Ambulatory Visit: Payer: Medicaid Other | Admitting: Family Medicine

## 2020-03-24 ENCOUNTER — Telehealth: Payer: Self-pay

## 2020-03-24 NOTE — Telephone Encounter (Signed)
Pt called stating that she needs PCP to fill out Authorization form stating the formula she uses is medically necessary so that insurance will cover it.   Call pt for any questions. [phone]

## 2020-03-25 ENCOUNTER — Other Ambulatory Visit: Payer: Self-pay | Admitting: Family Medicine

## 2020-03-25 DIAGNOSIS — F509 Eating disorder, unspecified: Secondary | ICD-10-CM

## 2020-03-25 NOTE — Telephone Encounter (Signed)
Pt rc for nurse 

## 2020-03-25 NOTE — Telephone Encounter (Signed)
Who is managing this?  Again, we have discussed at length I do NOT have experience managing outpatient NGT feeds.  I can write a letter but again this really should be going to whoever is MANAGING her feeds.

## 2020-03-28 DIAGNOSIS — R634 Abnormal weight loss: Secondary | ICD-10-CM | POA: Diagnosis not present

## 2020-03-28 DIAGNOSIS — R112 Nausea with vomiting, unspecified: Secondary | ICD-10-CM | POA: Diagnosis not present

## 2020-03-28 DIAGNOSIS — K5900 Constipation, unspecified: Secondary | ICD-10-CM | POA: Diagnosis not present

## 2020-03-28 MED ORDER — CYPROHEPTADINE HCL 4 MG PO TABS: 4.0000 mg | ORAL_TABLET | Freq: Three times a day (TID) | ORAL | 2 refills | Status: DC

## 2020-03-30 ENCOUNTER — Encounter: Payer: Self-pay | Admitting: Family Medicine

## 2020-04-11 ENCOUNTER — Encounter: Payer: Self-pay | Admitting: Family Medicine

## 2020-04-12 DIAGNOSIS — Z681 Body mass index (BMI) 19 or less, adult: Secondary | ICD-10-CM | POA: Diagnosis not present

## 2020-04-12 DIAGNOSIS — R634 Abnormal weight loss: Secondary | ICD-10-CM | POA: Diagnosis not present

## 2020-04-12 DIAGNOSIS — Z713 Dietary counseling and surveillance: Secondary | ICD-10-CM | POA: Diagnosis not present

## 2020-04-18 ENCOUNTER — Other Ambulatory Visit: Payer: Self-pay

## 2020-04-18 ENCOUNTER — Ambulatory Visit (INDEPENDENT_AMBULATORY_CARE_PROVIDER_SITE_OTHER): Payer: Medicaid Other | Admitting: Family Medicine

## 2020-04-18 ENCOUNTER — Encounter: Payer: Self-pay | Admitting: Family Medicine

## 2020-04-18 VITALS — BP 89/62 | HR 99 | Temp 97.6°F | Ht 63.0 in | Wt 103.6 lb

## 2020-04-18 DIAGNOSIS — I951 Orthostatic hypotension: Secondary | ICD-10-CM | POA: Diagnosis not present

## 2020-04-18 DIAGNOSIS — G8929 Other chronic pain: Secondary | ICD-10-CM | POA: Diagnosis not present

## 2020-04-18 DIAGNOSIS — R109 Unspecified abdominal pain: Secondary | ICD-10-CM | POA: Diagnosis not present

## 2020-04-18 DIAGNOSIS — E440 Moderate protein-calorie malnutrition: Secondary | ICD-10-CM | POA: Diagnosis not present

## 2020-04-18 NOTE — Progress Notes (Signed)
Subjective: CC: f/u malnutrition, orthostasis PCP: Raliegh Ip, DO NGE:[id number] Amber Marsh is a 25 y.o. female presenting to clinic today for:  1. Malnutrition Patient has now gotten the feeding tube out.  There was quite a Glass blower/designer with this apparently.  She is seeing a nutritionist every 3 months and does feel like she is making headway.  In fact she proudly states that she is now able to eat mashed potatoes, ice cream and rice.  She seems to be tolerating these without difficulty.  She does admits to some discomfort in her abdomen due to her "stomach stretching back out".  However, she has not had any issues with vomiting.  She continues to drink ensures 5 times per day and is supplementing milk and protein shakes.  She is also tried increasing fiber in her diet and this has resulted in a few bowel movements since our last visit.  She continues to have intermittent dizziness that is unchanged from previous.  No loss of consciousness.  Denies any chest pain, shortness of breath.  Urine output is normal.   ROS: Per HPI  Allergies  Allergen Reactions  . Motegrity [Prucalopride Succinate] Other (See Comments)    "made me feel like I was dying'  Chest tightness and shortness of breath  . Penicillins Other (See Comments)    From childhood: Did it involve swelling of the face/tongue/throat, SOB, or low BP? Unk Did it involve sudden or severe rash/hives, skin peeling, or any reaction on the inside of your mouth or nose? Unk Did you need to seek medical attention at a hospital or doctor's office? Yes When did it last happen? Childhood If all above answers are "NO", may proceed with cephalosporin use.    Clarise Cruz Extract [Nutritional Supplements] Nausea And Vomiting  . Morphine And Related Hypertension  . Latex Rash  . Tape Rash    Has needed Benadryl afterwards   Past Medical History:  Diagnosis Date  . Allergy    seasonal allergy  . Appendicitis 05/2016  . Asthma     exercise induced  . Constipation   . Migraines     Current Outpatient Medications:  .  albuterol (PROAIR HFA) 108 (90 Base) MCG/ACT inhaler, Inhale 1-2 puffs into the lungs every 4 (four) hours as needed for wheezing or shortness of breath. (Needs to be seen before next refill), Disp: 6.7 g, Rfl: 3 .  cyproheptadine (PERIACTIN) 4 MG tablet, Take 1 tablet (4 mg total) by mouth 3 (three) times daily., Disp: 90 tablet, Rfl: 2 .  dronabinol (MARINOL) 5 MG capsule, Take by mouth., Disp: , Rfl:  .  Ensure (ENSURE), Take 237 mLs by mouth. One can at 8:00 am, 12 noon, 4:00 pm and 8:00 pm, Disp: , Rfl:  .  mirtazapine (REMERON) 30 MG tablet, Take 1.5 tablets (45 mg total) by mouth at bedtime., Disp: 90 tablet, Rfl: 0 .  norelgestromin-ethinyl estradiol (XULANE) 150-35 MCG/24HR transdermal patch, Place 1 patch onto the skin once a week., Disp: 12 patch, Rfl: 4 .  pantoprazole (PROTONIX) 40 MG tablet, Take 1 tablet (40 mg total) by mouth daily before breakfast. Take 30 minutes before breakfast, Disp: 90 tablet, Rfl: 3 .  promethazine (PHENERGAN) 12.5 MG tablet, Take 1 tablet (12.5 mg total) by mouth every 6 (six) hours as needed for nausea or vomiting., Disp: 30 tablet, Rfl: 0 .  SUMAtriptan (IMITREX) 50 MG tablet, TAKE 1 TABLET BY MOUTH EVERY 2 HRS AS NEEDED FOR MIGRAINE. MAY REPEAT 1X IN  2 HOURS IF HEADACHE PERSISTS OR RECURS., Disp: 10 tablet, Rfl: 1 Social History   Socioeconomic History  . Marital status: Significant Other    Spouse name: Not on file  . Number of children: 0  . Years of education: Not on file  . Highest education level: Not on file  Occupational History  . Occupation: waitress    Comment: [employer]  Tobacco Use  . Smoking status: Current Every Day Smoker    Packs/day: 0.50    Years: 3.00    Pack years: 1.50    Types: Cigarettes  . Smokeless tobacco: Never Used  . Tobacco comment: one pack every 3 days  Vaping Use  . Vaping Use: Some days  Substance and Sexual  Activity  . Alcohol use: Yes    Comment: occasional  . Drug use: No  . Sexual activity: Yes    Birth control/protection: Patch  Other Topics Concern  . Not on file  Social History Narrative  . Not on file   Social Determinants of Health   Financial Resource Strain: Not on file  Food Insecurity: Not on file  Transportation Needs: Not on file  Physical Activity: Not on file  Stress: Not on file  Social Connections: Not on file  Intimate Partner Violence: Not on file   Family History  Problem Relation Age of Onset  . Migraines Mother   . Ovarian cysts Mother   . Hypertension Father   . Hypertension Maternal Grandmother   . Migraines Maternal Grandmother   . Cervical cancer Maternal Grandmother   . Diabetes Paternal Grandmother   . Crohn's disease Neg Hx   . Colon cancer Neg Hx   . Ulcerative colitis Neg Hx     Objective: Office vital signs reviewed. BP (!) 89/62   Pulse 99   Temp 97.6 F (36.4 C) (Temporal)   Ht 5\' 3"  (1.6 m)   Wt 103 lb 9.6 oz (47 kg)   LMP 04/08/2020   SpO2 100%   BMI 18.35 kg/m   Physical Examination:  General: Awake, alert, malnourished, No acute distress HEENT: Normal; sclera white. Cardio: regular rate and rhythm, S1S2 heard, no murmurs appreciated Pulm: clear to auscultation bilaterally, no wheezes, rhonchi or rales; normal work of breathing on room air GI: soft, non-tender, generalized tenderness to palpation.  No guarding. Bowel sounds present x4, no hepatomegaly, no splenomegaly, no masses Extremities: warm, well perfused, No edema, cyanosis or clubbing; +2 pulses bilaterally MSK: Ambulating independently  Orthostatic VS for the past 72 hrs (Last 3 readings):  Orthostatic BP  04/18/20 1330 102/68  04/18/20 1329 99/64  04/18/20 1328 97/64    Assessment/ Plan: 25 y.o. female   Moderate protein-calorie malnutrition (HCC) - Plan: CANCELED: EKG 12-Lead  Chronic abdominal pain - Plan: CANCELED: EKG 12-Lead  Orthostasis  Not  demonstrating any signs or symptoms of refeeding syndrome.  She is actually gained almost 6 pounds since her last visit.  I certainly think that she is headed in the right direction.  I have highly recommended that she continue to follow-up with nutritionist as directed and I would like to see her back in 6 weeks for another recheck.  Vital signs are looking better than previous check but she continues to have some level of orthostasis.  I think this will gradually improve  No orders of the defined types were placed in this encounter.  No orders of the defined types were placed in this encounter.    21, DO  [place] [phone]

## 2020-05-19 ENCOUNTER — Other Ambulatory Visit: Payer: Self-pay | Admitting: Family Medicine

## 2020-05-19 DIAGNOSIS — E440 Moderate protein-calorie malnutrition: Secondary | ICD-10-CM

## 2020-05-19 DIAGNOSIS — F509 Eating disorder, unspecified: Secondary | ICD-10-CM

## 2020-05-20 MED ORDER — ALBUTEROL SULFATE HFA 108 (90 BASE) MCG/ACT IN AERS: 1.0000 | INHALATION_SPRAY | RESPIRATORY_TRACT | 3 refills | Status: DC | PRN

## 2020-05-20 MED ORDER — MIRTAZAPINE 45 MG PO TABS: 45.0000 mg | ORAL_TABLET | Freq: Every day | ORAL | 3 refills | Status: DC

## 2020-05-30 ENCOUNTER — Ambulatory Visit: Payer: Medicaid Other | Admitting: Family Medicine

## 2020-05-30 ENCOUNTER — Encounter: Payer: Self-pay | Admitting: Family Medicine

## 2020-05-30 ENCOUNTER — Other Ambulatory Visit: Payer: Self-pay

## 2020-05-30 VITALS — BP 101/66 | HR 88 | Temp 98.3°F | Ht 63.0 in | Wt 110.6 lb

## 2020-05-30 DIAGNOSIS — E440 Moderate protein-calorie malnutrition: Secondary | ICD-10-CM | POA: Diagnosis not present

## 2020-05-30 DIAGNOSIS — H0015 Chalazion left lower eyelid: Secondary | ICD-10-CM

## 2020-05-30 MED ORDER — ERYTHROMYCIN 5 MG/GM OP OINT: 1.0000 "application " | TOPICAL_OINTMENT | Freq: Three times a day (TID) | OPHTHALMIC | 0 refills | Status: AC

## 2020-05-30 NOTE — Progress Notes (Signed)
Subjective: CC: Follow-up weight PCP: Raliegh Ip, DO Amber Marsh is a 25 y.o. female presenting to clinic today for:  1.  Malnutrition Patient reports that she has been doing pretty well.  She occasionally still has some difficulties with certain foods but she explains that she was able to consume a hamburger and [place] fries recently.  She enjoyed this quite a bit and continues to require Marinol for appetite but has been able to keep food down much easier.  Bowel movements are occurring every 2 days.  Denies any dizziness.  2.  Eye lesion Patient reports a lump under her left eyelid this been present for several days now.  She reports matting of her eyelashes in the morning.  She is only have blurred vision when she is tearing excessively.  Denies any pain with eye movement.  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  She has not sought care from an ophthalmologist.  She tried applying warm compresses but felt that this made symptoms worse and therefore has been using cold compresses.  She is used to OTC eyedrops with little improvement.  No fevers or pain with eye movement.  Does not wear contacts   ROS: Per HPI  Allergies  Allergen Reactions  . Motegrity [Prucalopride Succinate] Other (See Comments)    "made me feel like I was dying'  Chest tightness and shortness of breath  . Penicillins Other (See Comments)    From childhood: Did it involve swelling of the face/tongue/throat, SOB, or low BP? Unk Did it involve sudden or severe rash/hives, skin peeling, or any reaction on the inside of your mouth or nose? Unk Did you need to seek medical attention at a hospital or doctor's office? Yes When did it last happen? Childhood If all above answers are "NO", may proceed with cephalosporin use.    Clarise Cruz Extract [Nutritional Supplements] Nausea And Vomiting  . Morphine And Related Hypertension  . Latex Rash   . Tape Rash    Has needed Benadryl afterwards   Past Medical History:  Diagnosis Date  . Allergy    seasonal allergy  . Appendicitis 05/2016  . Asthma    exercise induced  . Constipation   . Migraines     Current Outpatient Medications:  .  mirtazapine (REMERON) 45 MG tablet, Take 1 tablet (45 mg total) by mouth at bedtime., Disp: 90 tablet, Rfl: 3 .  albuterol (PROAIR HFA) 108 (90 Base) MCG/ACT inhaler, Inhale 1-2 puffs into the lungs every 4 (four) hours as needed for wheezing or shortness of breath. (Needs to be seen before next refill), Disp: 6.7 g, Rfl: 3 .  cyproheptadine (PERIACTIN) 4 MG tablet, Take 1 tablet (4 mg total) by mouth 3 (three) times daily., Disp: 90 tablet, Rfl: 2 .  dronabinol (MARINOL) 5 MG capsule, Take by mouth., Disp: , Rfl:  .  Ensure (ENSURE), Take 237 mLs by mouth. One can at 8:00 am, 12 noon, 4:00 pm and 8:00 pm, Disp: , Rfl:  .  norelgestromin-ethinyl estradiol (XULANE) 150-35 MCG/24HR transdermal patch, Place 1 patch onto the skin once a week., Disp: 12 patch, Rfl: 4 .  pantoprazole (PROTONIX) 40 MG tablet, Take 1 tablet (40 mg total) by mouth daily before breakfast. Take 30 minutes before breakfast, Disp: 90 tablet, Rfl: 3 .  promethazine (PHENERGAN) 12.5 MG tablet, Take 1 tablet (12.5 mg total) by mouth every 6 (six) hours as needed for  nausea or vomiting., Disp: 30 tablet, Rfl: 0 .  SUMAtriptan (IMITREX) 50 MG tablet, TAKE 1 TABLET BY MOUTH EVERY 2 HRS AS NEEDED FOR MIGRAINE. MAY REPEAT 1X IN 2 HOURS IF HEADACHE PERSISTS OR RECURS., Disp: 10 tablet, Rfl: 1 Social History   Socioeconomic History  . Marital status: Significant Other    Spouse name: Not on file  . Number of children: 0  . Years of education: Not on file  . Highest education level: Not on file  Occupational History  . Occupation: waitress    Comment: [employer]  Tobacco Use  . Smoking status: Current Every Day Smoker    Packs/day: 0.50    Years: 3.00    Pack years: 1.50     Types: Cigarettes  . Smokeless tobacco: Never Used  . Tobacco comment: one pack every 3 days  Vaping Use  . Vaping Use: Some days  Substance and Sexual Activity  . Alcohol use: Yes    Comment: occasional  . Drug use: No  . Sexual activity: Yes    Birth control/protection: Patch  Other Topics Concern  . Not on file  Social History Narrative  . Not on file   Social Determinants of Health   Financial Resource Strain: Not on file  Food Insecurity: Not on file  Transportation Needs: Not on file  Physical Activity: Not on file  Stress: Not on file  Social Connections: Not on file  Intimate Partner Violence: Not on file   Family History  Problem Relation Age of Onset  . Migraines Mother   . Ovarian cysts Mother   . Hypertension Father   . Hypertension Maternal Grandmother   . Migraines Maternal Grandmother   . Cervical cancer Maternal Grandmother   . Diabetes Paternal Grandmother   . Crohn's disease Neg Hx   . Colon cancer Neg Hx   . Ulcerative colitis Neg Hx     Objective: Office vital signs reviewed. BP 101/66   Pulse 88   Temp 98.3 F (36.8 C)   Ht 5\' 3"  (1.6 m)   Wt 110 lb 9.6 oz (50.2 kg)   SpO2 98%   BMI 19.59 kg/m   Physical Examination:  General: Awake, alert, thin, No acute distress HEENT: Soft tissue swelling noted along the left lower eyelid.  This is consistent with chalazion.  She has no tenderness to palpation to the orbit.  EOMI, PERRLA.  Assessment/ Plan: 25 y.o. female   Moderate protein-calorie malnutrition (HCC)  Chalazion of left lower eyelid - Plan: erythromycin ophthalmic ointment  Her blood pressure has gotten better and weight improving.  She seems to be doing excellent at this point and so we will extend her out to 3 months  Lesion of eyes consistent with chalazion.  Erythromycin applied to the affected area 3 times daily prescribed.  We discussed reasons for reevaluation, emergent evaluation.  I have given her Dr. 21 information  if needed.  No evidence of septal or preseptal cellulitis  No orders of the defined types were placed in this encounter.  No orders of the defined types were placed in this encounter.    Marigene Ehlers, DO [facility] [phone]

## 2020-05-30 NOTE — Patient Instructions (Signed)
Chalazion  A chalazion is a swelling or lump on the eyelid. It can affect the upper eyelid or the lower eyelid. What are the causes? This condition may be caused by:  Long-lasting (chronic) inflammation of the eyelid glands.  A blocked oil gland in the eyelid. What are the signs or symptoms? Symptoms of this condition include:  Swelling of the eyelid. The swelling may spread to areas around the eye.  A hard lump on the eyelid.  Blurry vision. The lump on the eyelid may make it hard to see out of the eye. How is this diagnosed? This condition is diagnosed with an examination of the eye. How is this treated? This condition is treated by applying a warm compress to the eyelid. If the condition does not improve, it may be treated with:  Medicine that is injected into the chalazion by a health care provider.  Surgery.  Medicine that is applied to the eye. Follow these instructions at home: Managing pain and swelling  Apply a warm, moist compress to the eyelid 4-6 times a day for 10-15 minutes at a time. This will help to open any blocked glands and to reduce redness and swelling.  Apply over-the-counter and prescription medicines only as told by your health care provider. General instructions  Do not touch the chalazion.  Do not try to remove the pus. Do not squeeze the chalazion or stick it with a pin or needle.  Do not rub your eyes.  Wash your hands often. Dry your hands with a clean towel.  Keep your face, scalp, and eyebrows clean.  Avoid wearing eye makeup.  If the chalazion does not break open (rupture) on its own, return to your health care provider.  Keep all follow-up appointments as told by your health care provider. This is important. Contact a health care provider if:  Your eyelid has not improved in 4 weeks.  Your eyelid is getting worse.  You have a fever.  The chalazion does not rupture on its own after a month of home treatment. Get help right  away if:  You have pain in your eye.  Your vision changes.  The chalazion becomes painful or red.  The chalazion gets bigger. Summary  A chalazion is a swelling or lump on the upper or lower eyelid.  It may be caused by chronic inflammation or a blocked oil gland.  Apply a warm, moist compress to the eyelid 4-6 times a day for 10-15 minutes at a time.  Keep your face, scalp, and eyebrows clean. This information is not intended to replace advice given to you by your health care provider. Make sure you discuss any questions you have with your health care provider. Document Revised: 06/20/2017 Document Reviewed: 06/20/2017 Elsevier Patient Education  2021 Elsevier Inc.  

## 2020-07-27 DIAGNOSIS — W228XXA Striking against or struck by other objects, initial encounter: Secondary | ICD-10-CM | POA: Diagnosis not present

## 2020-07-27 DIAGNOSIS — S91114A Laceration without foreign body of right lesser toe(s) without damage to nail, initial encounter: Secondary | ICD-10-CM | POA: Diagnosis not present

## 2020-08-30 ENCOUNTER — Ambulatory Visit: Payer: Medicaid Other | Admitting: Family Medicine

## 2020-11-10 ENCOUNTER — Other Ambulatory Visit: Payer: Self-pay | Admitting: *Deleted

## 2020-11-10 DIAGNOSIS — Z6826 Body mass index (BMI) 26.0-26.9, adult: Secondary | ICD-10-CM | POA: Diagnosis not present

## 2020-11-10 DIAGNOSIS — S39012A Strain of muscle, fascia and tendon of lower back, initial encounter: Secondary | ICD-10-CM | POA: Diagnosis not present

## 2020-11-10 DIAGNOSIS — Z3045 Encounter for surveillance of transdermal patch hormonal contraceptive device: Secondary | ICD-10-CM

## 2020-11-14 ENCOUNTER — Other Ambulatory Visit: Payer: Self-pay | Admitting: *Deleted

## 2020-11-14 DIAGNOSIS — Z3045 Encounter for surveillance of transdermal patch hormonal contraceptive device: Secondary | ICD-10-CM

## 2020-12-09 ENCOUNTER — Encounter: Payer: Self-pay | Admitting: Family Medicine

## 2020-12-31 ENCOUNTER — Encounter: Payer: Self-pay | Admitting: Family Medicine

## 2021-01-23 ENCOUNTER — Telehealth: Payer: Medicaid Other | Admitting: Family Medicine

## 2021-02-14 ENCOUNTER — Encounter: Payer: Self-pay | Admitting: Family Medicine

## 2021-02-14 ENCOUNTER — Ambulatory Visit: Payer: Medicaid Other | Admitting: Family Medicine

## 2021-02-14 VITALS — BP 111/80 | HR 98 | Temp 97.9°F | Ht 63.0 in | Wt 181.2 lb

## 2021-02-14 DIAGNOSIS — Z3045 Encounter for surveillance of transdermal patch hormonal contraceptive device: Secondary | ICD-10-CM | POA: Diagnosis not present

## 2021-02-14 DIAGNOSIS — F32A Depression, unspecified: Secondary | ICD-10-CM

## 2021-02-14 DIAGNOSIS — F509 Eating disorder, unspecified: Secondary | ICD-10-CM

## 2021-02-14 DIAGNOSIS — E440 Moderate protein-calorie malnutrition: Secondary | ICD-10-CM

## 2021-02-14 LAB — PREGNANCY, URINE: Preg Test, Ur: NEGATIVE

## 2021-02-14 MED ORDER — ALBUTEROL SULFATE HFA 108 (90 BASE) MCG/ACT IN AERS: 1.0000 | INHALATION_SPRAY | RESPIRATORY_TRACT | 1 refills | Status: DC | PRN

## 2021-02-14 MED ORDER — MIRTAZAPINE 15 MG PO TABS: 15.0000 mg | ORAL_TABLET | Freq: Every day | ORAL | 3 refills | Status: DC

## 2021-02-14 MED ORDER — SUMATRIPTAN SUCCINATE 50 MG PO TABS: ORAL_TABLET | ORAL | 1 refills | Status: DC

## 2021-02-14 MED ORDER — MIRTAZAPINE 15 MG PO TABS: 15.0000 mg | ORAL_TABLET | Freq: Every day | ORAL | 3 refills | Status: AC

## 2021-02-14 MED ORDER — XULANE 150-35 MCG/24HR TD PTWK: 1.0000 | MEDICATED_PATCH | TRANSDERMAL | 4 refills | Status: AC

## 2021-02-14 MED ORDER — PROMETHAZINE HCL 12.5 MG PO TABS: 12.5000 mg | ORAL_TABLET | Freq: Four times a day (QID) | ORAL | 0 refills | Status: AC | PRN

## 2021-02-14 NOTE — Progress Notes (Signed)
Subjective: CC: Medication refills PCP: Janora Norlander, DO [id number] [place] is a 26 y.o. female presenting to clinic today for:  1.  Mood Patient reports that her mood has been under excellent control up until most recently.  She starting to see some depressive symptoms returned but she has been off of her mirtazapine for a while now.  She would like to resume at a lower dose.  She certainly sees mood fluctuations with her menstrual cycles and needs refill on her Xulane patch as well, which she has not used in 2 months.  Her last menstrual cycle was 2 months ago.  She is interested in ultimately pursuing pregnancy but understands that her menstrual cycles need to be regulated.  She has not followed up with her GYN to further discuss this.  Overall she has been in a better headspace.  [place] really [place] her spirit.  She has not had a migraine headache in a while but would like to get medications renewed just in case.  She is off of all of the medications which were in place for her appetite and has gotten better from that standpoint.  She is eating normally now.  She still has Protonix on board if needed but has not required it.   ROS: Per HPI  Allergies  Allergen Reactions   Motegrity [Prucalopride Succinate] Other (See Comments)    "made me feel like I was dying'  Chest tightness and shortness of breath   Penicillins Other (See Comments)    From childhood: Did it involve swelling of the face/tongue/throat, SOB, or low BP? Unk Did it involve sudden or severe rash/hives, skin peeling, or any reaction on the inside of your mouth or nose? Unk Did you need to seek medical attention at a hospital or doctor's office? Yes When did it last happen? Childhood       If all above answers are "NO", may proceed with cephalosporin use.     Grapeseed Extract [Nutritional Supplements] Nausea And Vomiting   Morphine And Related  Hypertension   Latex Rash   Tape Rash    Has needed Benadryl afterwards   Past Medical History:  Diagnosis Date   Allergy    seasonal allergy   Appendicitis 05/2016   Asthma    exercise induced   Constipation    Migraines     Current Outpatient Medications:    albuterol (PROAIR HFA) 108 (90 Base) MCG/ACT inhaler, Inhale 1-2 puffs into the lungs every 4 (four) hours as needed for wheezing or shortness of breath. (Needs to be seen before next refill), Disp: 6.7 g, Rfl: 3   cyproheptadine (PERIACTIN) 4 MG tablet, Take 1 tablet (4 mg total) by mouth 3 (three) times daily., Disp: 90 tablet, Rfl: 2   mirtazapine (REMERON) 45 MG tablet, Take 1 tablet (45 mg total) by mouth at bedtime., Disp: 90 tablet, Rfl: 3   norelgestromin-ethinyl estradiol (XULANE) 150-35 MCG/24HR transdermal patch, Place 1 patch onto the skin once a week., Disp: 12 patch, Rfl: 4   promethazine (PHENERGAN) 12.5 MG tablet, Take 1 tablet (12.5 mg total) by mouth every 6 (six) hours as needed for nausea or vomiting., Disp: 30 tablet, Rfl: 0   SUMAtriptan (IMITREX) 50 MG tablet, TAKE 1 TABLET BY MOUTH EVERY 2 HRS AS NEEDED FOR MIGRAINE. MAY REPEAT 1X IN 2 HOURS IF HEADACHE PERSISTS OR RECURS., Disp: 10 tablet, Rfl: 1   pantoprazole (PROTONIX) 40 MG  tablet, Take 1 tablet (40 mg total) by mouth daily before breakfast. Take 30 minutes before breakfast (Patient not taking: Reported on 02/14/2021), Disp: 90 tablet, Rfl: 3 Social History   Socioeconomic History   Marital status: Significant Other    Spouse name: Not on file   Number of children: 0   Years of education: Not on file   Highest education level: Not on file  Occupational History   Occupation: waitress    Comment: [employer]  Tobacco Use   Smoking status: Every Day    Packs/day: 0.50    Years: 3.00    Pack years: 1.50    Types: Cigarettes   Smokeless tobacco: Never   Tobacco comments:    one pack every 3 days  Vaping Use   Vaping Use: Some days   Substance and Sexual Activity   Alcohol use: Yes    Comment: occasional   Drug use: No   Sexual activity: Yes    Birth control/protection: Patch  Other Topics Concern   Not on file  Social History Narrative   Not on file   Social Determinants of Health   Financial Resource Strain: Not on file  Food Insecurity: Not on file  Transportation Needs: Not on file  Physical Activity: Not on file  Stress: Not on file  Social Connections: Not on file  Intimate Partner Violence: Not on file   Family History  Problem Relation Age of Onset   Migraines Mother    Ovarian cysts Mother    Hypertension Father    Hypertension Maternal Grandmother    Migraines Maternal Grandmother    Cervical cancer Maternal Grandmother    Diabetes Paternal Grandmother    Crohn's disease Neg Hx    Colon cancer Neg Hx    Ulcerative colitis Neg Hx     Objective: Office vital signs reviewed. BP 111/80    Pulse 98    Temp 97.9 F (36.6 C)    Ht 5\' 3"  (1.6 m)    Wt 181 lb 3.2 oz (82.2 kg)    SpO2 96%    BMI 32.10 kg/m   Physical Examination:  General: Awake, alert, well nourished, well appearing female. No acute distress HEENT: Sclera white.  Moist mucous membranes Cardio: regular rate and rhythm, S1S2 heard, no murmurs appreciated Pulm: clear to auscultation bilaterally, no wheezes, rhonchi or rales; normal work of breathing on room air Skin: dry; intact; no rashes or lesions Psych: Happy appearing.  Good eye contact.  Depression screen [facility] 2/9 02/14/2021 05/30/2020 04/18/2020  Decreased Interest 1 0 0  Down, Depressed, Hopeless 2 1 0  PHQ - 2 Score 3 1 0  Altered sleeping 2 - -  Tired, decreased energy 2 - -  Change in appetite 0 - -  Feeling bad or failure about yourself  0 - -  Trouble concentrating 0 - -  Moving slowly or fidgety/restless 0 - -  Suicidal thoughts 0 - -  PHQ-9 Score 7 - -  Difficult doing work/chores Not difficult at all - -   GAD 7 : Generalized Anxiety Score 02/14/2021  05/30/2020 02/18/2020 01/12/2020  Nervous, Anxious, on Edge 1 1 1 3   Control/stop worrying 1 0 3 1  Worry too much - different things 2 1 3 1   Trouble relaxing 0 0 1 0  Restless 0 0 0 0  Easily annoyed or irritable 2 0 2 2  Afraid - awful might happen 0 0 1 1  Total GAD 7 Score  6 2 11 8   Anxiety Difficulty Somewhat difficult - - Somewhat difficult    Assessment/ Plan: 26 y.o. female   1. Encounter for surveillance of transdermal patch hormonal contraceptive device Urine pregnancy negative.  Patch renewed.  Encouraged her to follow-up with OB/GYN for fertility advice and management - Pregnancy, urine - norelgestromin-ethinyl estradiol Marilu Favre) 150-35 MCG/24HR transdermal patch; Place 1 patch onto the skin once a week.  Dispense: 12 patch; Refill: 4  2.  Depressive disorder Resume use of mirtazapine at 15 mg at bedtime.  May follow-up in the next couple of months if symptoms are not improving or if she is having any other concerning symptoms or side effects - mirtazapine (REMERON) 15 MG tablet; Take 1 tablet (15 mg total) by mouth at bedtime.  Dispense: 90 tablet; Refill: 3  3. Eating disorder, unspecified type In remission.  Her weight has come back up to an appropriate level.  We will need to watch make sure we do not push her into obesity with the mirtazapine.  However, she did appear well on exam today - mirtazapine (REMERON) 15 MG tablet; Take 1 tablet (15 mg total) by mouth at bedtime.  Dispense: 90 tablet; Refill: 3   Orders Placed This Encounter  Procedures   Pregnancy, urine   Meds ordered this encounter  Medications   albuterol (PROAIR HFA) 108 (90 Base) MCG/ACT inhaler    Sig: Inhale 1-2 puffs into the lungs every 4 (four) hours as needed for wheezing or shortness of breath.    Dispense:  6.7 g    Refill:  1   DISCONTD: mirtazapine (REMERON) 15 MG tablet    Sig: Take 1 tablet (15 mg total) by mouth at bedtime.    Dispense:  90 tablet    Refill:  3    norelgestromin-ethinyl estradiol Marilu Favre) 150-35 MCG/24HR transdermal patch    Sig: Place 1 patch onto the skin once a week.    Dispense:  12 patch    Refill:  4   promethazine (PHENERGAN) 12.5 MG tablet    Sig: Take 1 tablet (12.5 mg total) by mouth every 6 (six) hours as needed for nausea or vomiting.    Dispense:  30 tablet    Refill:  0   SUMAtriptan (IMITREX) 50 MG tablet    Sig: TAKE 1 TABLET BY MOUTH EVERY 2 HRS AS NEEDED FOR MIGRAINE. MAY REPEAT 1X IN 2 HOURS IF HEADACHE PERSISTS OR RECURS.    Dispense:  10 tablet    Refill:  1   mirtazapine (REMERON) 15 MG tablet    Sig: Take 1 tablet (15 mg total) by mouth at bedtime.    Dispense:  90 tablet    Refill:  [place], [place] [phone]

## 2021-03-15 ENCOUNTER — Encounter: Payer: Self-pay | Admitting: Family Medicine

## 2021-03-16 MED ORDER — SUMATRIPTAN SUCCINATE 50 MG PO TABS: ORAL_TABLET | ORAL | 1 refills | Status: AC

## 2021-03-16 NOTE — Addendum Note (Signed)
Addended by: Quay Burow on: 03/16/2021 08:30 AM ? ? Modules accepted: Orders ? ?

## 2021-04-05 ENCOUNTER — Other Ambulatory Visit: Payer: Self-pay | Admitting: Family Medicine

## 2021-05-02 ENCOUNTER — Encounter: Payer: Self-pay | Admitting: Family Medicine

## 2021-05-04 ENCOUNTER — Encounter: Payer: Self-pay | Admitting: Family Medicine

## 2021-05-13 ENCOUNTER — Encounter: Payer: Self-pay | Admitting: Family Medicine

## 2021-05-15 ENCOUNTER — Other Ambulatory Visit: Payer: Self-pay | Admitting: Family Medicine

## 2021-05-15 MED ORDER — ALBUTEROL SULFATE HFA 108 (90 BASE) MCG/ACT IN AERS: INHALATION_SPRAY | RESPIRATORY_TRACT | 1 refills | Status: AC

## 2021-06-23 ENCOUNTER — Ambulatory Visit: Payer: Medicaid Other | Admitting: Family Medicine

## 2021-06-23 ENCOUNTER — Other Ambulatory Visit (HOSPITAL_COMMUNITY)
Admission: RE | Admit: 2021-06-23 | Discharge: 2021-06-23 | Disposition: A | Discharge status: Home / Self Care | Payer: Medicaid Other | Source: Ambulatory Visit | Attending: Family Medicine | Admitting: Family Medicine

## 2021-06-23 ENCOUNTER — Encounter: Payer: Self-pay | Admitting: Family Medicine

## 2021-06-23 ENCOUNTER — Other Ambulatory Visit: Payer: Self-pay | Admitting: Family Medicine

## 2021-06-23 VITALS — BP 114/79 | HR 92 | Temp 98.0°F | Ht 63.0 in | Wt 190.6 lb

## 2021-06-23 DIAGNOSIS — Z13228 Encounter for screening for other metabolic disorders: Secondary | ICD-10-CM | POA: Diagnosis not present

## 2021-06-23 DIAGNOSIS — Z124 Encounter for screening for malignant neoplasm of cervix: Secondary | ICD-10-CM | POA: Diagnosis not present

## 2021-06-23 DIAGNOSIS — N898 Other specified noninflammatory disorders of vagina: Secondary | ICD-10-CM

## 2021-06-23 DIAGNOSIS — Z319 Encounter for procreative management, unspecified: Secondary | ICD-10-CM | POA: Diagnosis not present

## 2021-06-23 DIAGNOSIS — Z0001 Encounter for general adult medical examination with abnormal findings: Secondary | ICD-10-CM

## 2021-06-23 DIAGNOSIS — B9689 Other specified bacterial agents as the cause of diseases classified elsewhere: Secondary | ICD-10-CM

## 2021-06-23 DIAGNOSIS — Z806 Family history of leukemia: Secondary | ICD-10-CM

## 2021-06-23 DIAGNOSIS — Z0000 Encounter for general adult medical examination without abnormal findings: Secondary | ICD-10-CM

## 2021-06-23 LAB — WET PREP FOR TRICH, YEAST, CLUE
Clue Cell Exam: POSITIVE — AB
Trichomonas Exam: NEGATIVE
Yeast Exam: NEGATIVE

## 2021-06-23 LAB — BAYER DCA HB A1C WAIVED: HB A1C (BAYER DCA - WAIVED): 5.2 % (ref 4.8–5.6)

## 2021-06-23 MED ORDER — METRONIDAZOLE 500 MG PO TABS: 500.0000 mg | ORAL_TABLET | Freq: Two times a day (BID) | ORAL | 0 refills | Status: AC

## 2021-06-23 NOTE — Patient Instructions (Signed)
You had labs performed today.  You will be contacted with the results of the labs once they are available, usually in the next 3 business days for routine lab work.  If you have an active my chart account, they will be released to your MyChart.  If you prefer to have these labs released to you via telephone, please let us know.   Pap Test Why am I having this test? A Pap test, also called a Pap smear, is a screening test to check for signs of: Infection. Cancer of the cervix. The cervix is the lower part of the uterus that opens into the vagina. Changes that may be a sign that cancer is developing (precancerous changes). Women need this test on a regular basis. In general, you should have a Pap test every 3 years until you reach menopause or age 87. Women aged 30-60 may choose to have their Pap test done at the same time as an HPV (human papillomavirus) test every 5 years (instead of every 3 years). Your health care provider may recommend having Pap tests more or less often depending on your medical conditions and past Pap test results. What is being tested? Cervical cells are tested for signs of infection or abnormalities. What kind of sample is taken?  Your health care provider will collect a sample of cells from the surface of your cervix. This will be done using a small cotton swab, plastic spatula, or brush that is inserted into your vagina using a tool called a speculum. This sample is often collected during a pelvic exam, when you are lying on your back on an exam table with your feet in footrests (stirrups). In some cases, fluids (secretions) from the cervix or vagina may also be collected. How do I prepare for this test? Be aware of where you are in your menstrual cycle. If you are menstruating on the day of the test, you may be asked to reschedule. You may need to reschedule if you have a known vaginal infection on the day of the test. Follow instructions from your health care provider  about: Changing or stopping your regular medicines. Some medicines can cause abnormal test results, such as vaginal medicines and tetracycline. Avoiding douching 2-3 days before or the day of the test. Tell a health care provider about: Any allergies you have. All medicines you are taking, including vitamins, herbs, eye drops, creams, and over-the-counter medicines. Any bleeding problems you have. Any surgeries you have had. Any medical conditions you have. Whether you are pregnant or may be pregnant. How are the results reported? Your test results will be reported as either abnormal or normal. What do the results mean? A normal test result means that you do not have signs of cancer of the cervix. An abnormal result may mean that you have: Cancer. A Pap test by itself is not enough to diagnose cancer. You will have more tests done if cancer is suspected. Precancerous changes in your cervix. Inflammation of the cervix. An STI (sexually transmitted infection). A fungal infection. A parasite infection. Talk with your health care provider about what your results mean. In some cases, your health care provider may do more testing to confirm the results. Questions to ask your health care provider Ask your health care provider, or the department that is doing the test: When will my results be ready? How will I get my results? What are my treatment options? What other tests do I need? What are my next steps?  Summary In general, women should have a Pap test every 3 years until they reach menopause or age 59. Your health care provider will collect a sample of cells from the surface of your cervix. This will be done using a small cotton swab, plastic spatula, or brush. In some cases, fluids (secretions) from the cervix or vagina may also be collected. This information is not intended to replace advice given to you by your health care provider. Make sure you discuss any questions you have with  your health care provider. Document Revised: 04/01/2020 Document Reviewed: 04/01/2020 Elsevier Patient Education  [employer].

## 2021-06-23 NOTE — Progress Notes (Signed)
Tisheena Maguire is a 26 y.o. female presents to office today for annual physical exam examination.    Concerns today include: 1. ***  Occupation: ***, Marital status: ***, Substance use: *** Diet: ***, Exercise: *** Last eye exam: *** Last dental exam: *** Last colonoscopy: *** Last mammogram: *** Last pap smear: *** Refills needed today: *** Immunizations needed: Immunization History  Administered Date(s) Administered   DTaP 02/04/1998, 04/26/1998, 09/13/1998, 08/07/1999, 09/01/2002   HPV Quadrivalent 06/05/2011, 08/06/2011, 12/06/2011   Hepatitis A 01/01/2005, 11/14/2005   Hepatitis A, Adult 01/01/2005, 11/14/2005   Hepatitis A, Ped/Adol-2 Dose 01/01/2005, 11/14/2005   Hepatitis B 09/13/1998, 05/08/1999, 07/28/2001   HiB (PRP-OMP) 02/04/1998, 04/26/1998, 09/13/1998, 05/08/1999   Hpv-Unspecified 06/05/2011, 08/06/2011, 12/06/2011   IPV 02/04/1998, 04/26/1998, 08/07/1999, 09/01/2002   Influenza Nasal 01/01/2005, 11/05/2006, 01/28/2008   Influenza Split 11/14/2005, 12/06/2011   Influenza, Seasonal, Injecte, Preservative Fre 01/28/2008, 11/02/2008   MMR 05/08/1999, 09/01/2002   Tdap 05/05/2008, 05/06/2019     Past Medical History:  Diagnosis Date   Allergy    seasonal allergy   Appendicitis 05/2016   Asthma    exercise induced   Constipation    Migraines    Social History   Socioeconomic History   Marital status: Significant Other    Spouse name: Not on file   Number of children: 0   Years of education: Not on file   Highest education level: Not on file  Occupational History   Occupation: waitress    Comment: [employer]  Tobacco Use   Smoking status: Every Day    Packs/day: 0.50    Years: 3.00    Total pack years: 1.50    Types: Cigarettes   Smokeless tobacco: Never   Tobacco comments:    one pack every 3 days  Vaping Use   Vaping Use: Some days  Substance and Sexual Activity   Alcohol use: Yes    Comment: occasional   Drug use: No   Sexual  activity: Yes    Birth control/protection: Patch  Other Topics Concern   Not on file  Social History Narrative   Not on file   Social Determinants of Health   Financial Resource Strain: Not on file  Food Insecurity: Not on file  Transportation Needs: Not on file  Physical Activity: Not on file  Stress: Not on file  Social Connections: Not on file  Intimate Partner Violence: Not on file   Past Surgical History:  Procedure Laterality Date   APPENDECTOMY  06/10/2016   BIOPSY  09/26/2018   Procedure: BIOPSY;  Surgeon: Danie Binder, MD;  [facility];  Service: Endoscopy;;   BIOPSY  10/02/2018   Procedure: BIOPSY;  Surgeon: Danie Binder, MD;  Location: [facility];  Service: Endoscopy;;  gastric and esophageal   ELBOW SURGERY Right    in elementary school   ESOPHAGOGASTRODUODENOSCOPY  2016   [facility], [place]   ESOPHAGOGASTRODUODENOSCOPY N/A 09/26/2018   Procedure: ESOPHAGOGASTRODUODENOSCOPY (EGD);  Surgeon: Danie Binder, MD;  [facility]: [facility];  Service: Endoscopy;  Laterality: N/A;  3:00pm   ESOPHAGOGASTRODUODENOSCOPY (EGD) WITH PROPOFOL N/A 10/02/2018   Procedure: ESOPHAGOGASTRODUODENOSCOPY (EGD) WITH PROPOFOL;  Surgeon: Danie Binder, MD;  [facility]: [facility];  Service: Endoscopy;  Laterality: N/A;  7:30am   KNEE SURGERY Right 2018   LAPAROSCOPIC APPENDECTOMY N/A 06/10/2016   Procedure: APPENDECTOMY LAPAROSCOPIC OPEN;  Surgeon: Erroll Luna, MD;  Location: [place];  Service: General;  Laterality: N/A;  REMOVAL OF GASTROSTOMY TUBE  03/2020   SAVORY DILATION  10/02/2018   Procedure: SAVORY DILATION;  Surgeon: Danie Binder, MD;  [facility]: [facility];  Service: Endoscopy;;   Family History  Problem Relation Age of Onset   Migraines Mother    Ovarian cysts Mother    Leukemia Mother    Hypertension Father    Hypertension Maternal Grandmother    Migraines Maternal Grandmother    Cervical cancer Maternal Grandmother    Diabetes  Paternal Grandmother    Crohn's disease Neg Hx    Colon cancer Neg Hx    Ulcerative colitis Neg Hx     Current Outpatient Medications:    albuterol (VENTOLIN HFA) 108 (90 Base) MCG/ACT inhaler, INHALE 1-2 PUFFS INTO THE LUNGS EVERY 4 HOURS AS NEEDED FOR WHEEZING OR SHORTNESS OF BREATH., Disp: 18 each, Rfl: 1   mirtazapine (REMERON) 15 MG tablet, Take 1 tablet (15 mg total) by mouth at bedtime., Disp: 90 tablet, Rfl: 3   norelgestromin-ethinyl estradiol (XULANE) 150-35 MCG/24HR transdermal patch, Place 1 patch onto the skin once a week., Disp: 12 patch, Rfl: 4   pantoprazole (PROTONIX) 40 MG tablet, Take 1 tablet (40 mg total) by mouth daily before breakfast. Take 30 minutes before breakfast (Patient not taking: Reported on 02/14/2021), Disp: 90 tablet, Rfl: 3   promethazine (PHENERGAN) 12.5 MG tablet, Take 1 tablet (12.5 mg total) by mouth every 6 (six) hours as needed for nausea or vomiting., Disp: 30 tablet, Rfl: 0   SUMAtriptan (IMITREX) 50 MG tablet, TAKE 1 TABLET BY MOUTH EVERY 2 HRS AS NEEDED FOR MIGRAINE. MAY REPEAT 1X IN 2 HOURS IF HEADACHE PERSISTS OR RECURS., Disp: 10 tablet, Rfl: 1  Allergies  Allergen Reactions   Motegrity [Prucalopride Succinate] Other (See Comments)    "made me feel like I was dying'  Chest tightness and shortness of breath   Penicillins Other (See Comments)    From childhood: Did it involve swelling of the face/tongue/throat, SOB, or low BP? Unk Did it involve sudden or severe rash/hives, skin peeling, or any reaction on the inside of your mouth or nose? Unk Did you need to seek medical attention at a hospital or doctor's office? Yes When did it last happen? Childhood       If all above answers are "NO", may proceed with cephalosporin use.     Grapeseed Extract [Nutritional Supplements] Nausea And Vomiting   Morphine And Related Hypertension   Latex Rash   Tape Rash    Has needed Benadryl afterwards     ROS: Review of Systems {ros; complete:30496}     Physical exam {Exam, Complete:[phone]}    Assessment/ Plan: Barry Dienes here for annual physical exam.   No problem-specific Assessment & Plan notes found for this encounter.   Counseled on healthy lifestyle choices, including diet (rich in fruits, vegetables and lean meats and low in salt and simple carbohydrates) and exercise (at least 30 minutes of moderate physical activity daily).  Patient to follow up in 1 year for annual exam or sooner if needed.  Dawson Albers M. Lajuana Ripple, DO

## 2021-06-24 ENCOUNTER — Encounter: Payer: Self-pay | Admitting: Family Medicine

## 2021-06-24 LAB — CBC
Hematocrit: 43.9 % (ref 34.0–46.6)
Hemoglobin: 14.6 g/dL (ref 11.1–15.9)
MCH: 29 pg (ref 26.6–33.0)
MCHC: 33.3 g/dL (ref 31.5–35.7)
MCV: 87 fL (ref 79–97)
Platelets: 284 10*3/uL (ref 150–450)
RBC: 5.03 x10E6/uL (ref 3.77–5.28)
RDW: 12.6 % (ref 11.7–15.4)
WBC: 8.9 10*3/uL (ref 3.4–10.8)

## 2021-06-24 LAB — CMP14+EGFR
ALT: 12 IU/L (ref 0–32)
AST: 21 IU/L (ref 0–40)
Albumin/Globulin Ratio: 1.5 (ref 1.2–2.2)
Albumin: 4.3 g/dL (ref 3.9–5.0)
Alkaline Phosphatase: 126 IU/L — ABNORMAL HIGH (ref 44–121)
BUN/Creatinine Ratio: 10 (ref 9–23)
BUN: 6 mg/dL (ref 6–20)
Bilirubin Total: 0.4 mg/dL (ref 0.0–1.2)
CO2: 22 mmol/L (ref 20–29)
Calcium: 9.5 mg/dL (ref 8.7–10.2)
Chloride: 102 mmol/L (ref 96–106)
Creatinine, Ser: 0.58 mg/dL (ref 0.57–1.00)
Globulin, Total: 2.8 g/dL (ref 1.5–4.5)
Glucose: 86 mg/dL (ref 70–99)
Potassium: 4.1 mmol/L (ref 3.5–5.2)
Sodium: 139 mmol/L (ref 134–144)
Total Protein: 7.1 g/dL (ref 6.0–8.5)
eGFR: 130 mL/min/{1.73_m2} (ref >59)

## 2021-06-24 LAB — TSH: TSH: 1.16 u[IU]/mL (ref 0.450–4.500)

## 2021-06-25 ENCOUNTER — Encounter: Payer: Self-pay | Admitting: Family Medicine

## 2021-06-28 LAB — CYTOLOGY - PAP
Chlamydia: NEGATIVE
Comment: NEGATIVE
Comment: NORMAL
Diagnosis: NEGATIVE
Diagnosis: REACTIVE
Neisseria Gonorrhea: NEGATIVE

## 2021-07-06 ENCOUNTER — Encounter: Payer: Medicaid Other | Admitting: Obstetrics & Gynecology

## 2021-07-26 ENCOUNTER — Encounter: Payer: Medicaid Other | Admitting: Obstetrics & Gynecology
